# Patient Record
Sex: Male | Born: 1955
Health system: Southern US, Community
[De-identification: ages and names within clinical notes are randomized; demographics above are authoritative.]

## PROBLEM LIST (undated history)

## (undated) DIAGNOSIS — E7800 Pure hypercholesterolemia, unspecified: Secondary | ICD-10-CM

## (undated) DIAGNOSIS — H9319 Tinnitus, unspecified ear: Secondary | ICD-10-CM

## (undated) DIAGNOSIS — E291 Testicular hypofunction: Secondary | ICD-10-CM

## (undated) HISTORY — PX: CARDIOVERSION: SHX1299

## (undated) HISTORY — DX: Tinnitus, unspecified ear: H93.19

## (undated) HISTORY — DX: Pure hypercholesterolemia, unspecified: E78.00

## (undated) HISTORY — DX: Testicular hypofunction: E29.1

---

## 2013-12-26 ENCOUNTER — Encounter: Payer: 59 | Attending: Family Medicine | Admitting: Dietician

## 2013-12-26 DIAGNOSIS — Z713 Dietary counseling and surveillance: Secondary | ICD-10-CM | POA: Insufficient documentation

## 2013-12-30 NOTE — Progress Notes (Signed)
Patient was seen on 12/26/2013 for the Weight Loss Class at the [facility]. The following learning objectives were met by the patient during this class:   Describe healthy choices in each food group  Describe portion size of foods  Use plate method for meal planning  Demonstrate how to read Nutrition Facts food label  Set realistic goals for weight loss, diet changes, and physical activity.   Goals:  1. Make healthy food choices in each food group.  2. Reduce portion size of foods.  3. Increase fruit and vegetable intake.  4. Use plate method for meal planning.  5. Increase physical activity.   Handouts given:  1. Weight loss tips 2. Meal plan/portion card 2. Plate method  2. Food label handout

## 2015-07-13 ENCOUNTER — Telehealth: Payer: 59 | Admitting: Family

## 2015-07-13 DIAGNOSIS — J019 Acute sinusitis, unspecified: Secondary | ICD-10-CM

## 2015-07-13 MED ORDER — AMOXICILLIN-POT CLAVULANATE 875-125 MG PO TABS: 1.0000 | ORAL_TABLET | Freq: Two times a day (BID) | ORAL | Status: DC

## 2015-07-13 NOTE — Progress Notes (Signed)

## 2015-07-14 MED FILL — AMOX-CLAV 875-125 MG TABLET: 875-125 | 7 days supply | Qty: 14 | Fill #0

## 2015-07-18 ENCOUNTER — Encounter: Payer: Self-pay | Admitting: *Deleted

## 2015-07-18 ENCOUNTER — Encounter: Payer: 59 | Attending: Family Medicine | Admitting: *Deleted

## 2015-07-18 DIAGNOSIS — Z713 Dietary counseling and surveillance: Secondary | ICD-10-CM | POA: Diagnosis not present

## 2015-07-18 DIAGNOSIS — E663 Overweight: Secondary | ICD-10-CM

## 2015-07-18 NOTE — Patient Instructions (Signed)
-    Follow MyPlate guidelines with half of the plate fruits/vegetables, 1/4 protein, 1/4 grains - Have a snack between lunch and dinner that includes a carbohydrate and protein source - Include grains at lunch and dinner - Consume a protein/carbohydrate combined shake or snack within 30 minutes post workout - Try weight lifting prior to cardio activities to gauge strength levels during resistance training - Increase intake at breakfast - add nuts to yogurt/fruit combo

## 2015-07-18 NOTE — Progress Notes (Signed)
  Medical Nutrition Therapy:  Appt start time: 0800 end time:  0900.   Assessment:  Primary concerns today: pt is a [employer] employee who is hoping to qualify for his healthy weight badge. Pt is interested in weight loss. Pt is down 5 lbs in last 5 days because pt recently had the flu. Has a goal of losing 5 lbs from where pt is now. Pt talks to doctor about BMI and doctor said not to worry about it but pt wants to weigh under 180 lbs. Pt is currently around 182, self reported. Pt used the Tanita scale to see breakdown of body fat vs fat free mass. Pt has been restricting carbohydrates in the diet in order to lose weight.    Preferred Learning Style:   No preference indicated   Learning Readiness:   Ready   MEDICATIONS: see medication list.    DIETARY INTAKE:  Usual eating pattern includes 3 meals and 1 snack per day.  Everyday foods include breakfast foods.  Avoided foods include potatoes, fried foods, pasta.    24-hr recall:  B ( AM): greek yogurt (1/2 cup), fruit, skim milk Snk ( AM): on occasion - nuts or kind bar L ( PM): salad or leftovers - bring lunch Snk ( PM): none D ( PM): grilled meats and vegetables Snk ( PM): none Beverages: water(3-4 bottles) and with meals and coffee, red wine (2 glasses)  Usual physical activity: Gym 3 days per week: walk 5 min warm up, stair master 54min, walk 71min, weight room - full body bench press, row, leg exercises, abs, shoulders, arms. Heavy on Saturday (3 sets, 8 reps). 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      Nutritional Diagnosis:  NB-1.1 Food and nutrition-related knowledge deficit As related to proper balance of carbohydrates, fat, and protein.  As evidenced by dietary recall.    Intervention:  Nutrition counseling provided. Talked to pt about MyPlate and how to balance meals and snacks. Talked about inadequate intake based on energy needs and how this can slow down the metabolism. Talked about fueling for exercise, pre and post.   Teaching  Method Utilized:  Visual Auditory   Handouts given during visit include:  Snack List  Barriers to learning/adherence to lifestyle change: none  Demonstrated degree of understanding via:  Teach Back   Monitoring/Evaluation:  Dietary intake, exercise, and body weight prn.

## 2015-08-12 MED FILL — MOMETASONE FUROATE 50 MCG S: 50 | 90 days supply | Qty: 51 | Fill #0

## 2015-08-15 MED FILL — SIMVASTATIN 40 MG TABLET: 40 | 90 days supply | Qty: 90 | Fill #0

## 2015-08-24 DIAGNOSIS — H5213 Myopia, bilateral: Secondary | ICD-10-CM | POA: Diagnosis not present

## 2015-08-24 DIAGNOSIS — H524 Presbyopia: Secondary | ICD-10-CM | POA: Diagnosis not present

## 2015-08-24 DIAGNOSIS — H40053 Ocular hypertension, bilateral: Secondary | ICD-10-CM | POA: Diagnosis not present

## 2015-08-24 DIAGNOSIS — H52223 Regular astigmatism, bilateral: Secondary | ICD-10-CM | POA: Diagnosis not present

## 2015-11-15 MED FILL — SIMVASTATIN 40 MG TABLET: 40 | 90 days supply | Qty: 90 | Fill #1

## 2015-11-15 MED FILL — MOMETASONE FUROATE 50 MCG S: 50 | 90 days supply | Qty: 51 | Fill #1

## 2016-01-31 MED FILL — SIMVASTATIN 40 MG TABLET: 40 | 90 days supply | Qty: 90 | Fill #2

## 2016-03-19 DIAGNOSIS — Z0000 Encounter for general adult medical examination without abnormal findings: Secondary | ICD-10-CM | POA: Diagnosis not present

## 2016-03-21 DIAGNOSIS — Z1389 Encounter for screening for other disorder: Secondary | ICD-10-CM | POA: Diagnosis not present

## 2016-03-21 DIAGNOSIS — Z0000 Encounter for general adult medical examination without abnormal findings: Secondary | ICD-10-CM | POA: Diagnosis not present

## 2016-03-21 DIAGNOSIS — Z9181 History of falling: Secondary | ICD-10-CM | POA: Diagnosis not present

## 2016-03-21 MED FILL — NYSTATIN/TRIAMCINOLONE CRM: 100000-0.1 | 30 days supply | Qty: 30 | Fill #0

## 2016-04-17 MED FILL — SIMVASTATIN 40 MG TABLET: 40 | 90 days supply | Qty: 90 | Fill #0

## 2016-04-17 MED FILL — NYSTATIN-TRIAMCINOLONE CRM: 100000-0.1 | 30 days supply | Qty: 30 | Fill #1

## 2016-04-17 MED FILL — MOMETASONE FUROATE 50 MCG S: 50 | 90 days supply | Qty: 51 | Fill #0

## 2016-05-09 DIAGNOSIS — L821 Other seborrheic keratosis: Secondary | ICD-10-CM | POA: Diagnosis not present

## 2016-05-09 DIAGNOSIS — L905 Scar conditions and fibrosis of skin: Secondary | ICD-10-CM | POA: Diagnosis not present

## 2016-05-09 DIAGNOSIS — D485 Neoplasm of uncertain behavior of skin: Secondary | ICD-10-CM | POA: Diagnosis not present

## 2016-05-09 DIAGNOSIS — L739 Follicular disorder, unspecified: Secondary | ICD-10-CM | POA: Diagnosis not present

## 2016-05-09 DIAGNOSIS — L720 Epidermal cyst: Secondary | ICD-10-CM | POA: Diagnosis not present

## 2016-05-11 DIAGNOSIS — Z79899 Other long term (current) drug therapy: Secondary | ICD-10-CM | POA: Diagnosis not present

## 2016-05-11 DIAGNOSIS — Z0000 Encounter for general adult medical examination without abnormal findings: Secondary | ICD-10-CM | POA: Diagnosis not present

## 2016-05-18 DIAGNOSIS — E291 Testicular hypofunction: Secondary | ICD-10-CM | POA: Diagnosis not present

## 2016-05-18 DIAGNOSIS — Z1389 Encounter for screening for other disorder: Secondary | ICD-10-CM | POA: Diagnosis not present

## 2016-05-18 DIAGNOSIS — R5383 Other fatigue: Secondary | ICD-10-CM | POA: Diagnosis not present

## 2016-07-13 MED FILL — NYSTATIN-TRIAMCINOLONE CRM: 100000-0.1 | 30 days supply | Qty: 30 | Fill #2

## 2016-07-13 MED FILL — SIMVASTATIN 40 MG TABLET: 40 | 90 days supply | Qty: 90 | Fill #1

## 2016-10-08 MED FILL — SIMVASTATIN 40 MG TABLET: 40 | 90 days supply | Qty: 90 | Fill #2

## 2016-11-20 ENCOUNTER — Telehealth: Payer: 59 | Admitting: Family

## 2016-11-20 DIAGNOSIS — L237 Allergic contact dermatitis due to plants, except food: Secondary | ICD-10-CM | POA: Diagnosis not present

## 2016-11-20 MED ORDER — TRIAMCINOLONE ACETONIDE 0.1 % EX CREA: 1.0000 "application " | TOPICAL_CREAM | Freq: Two times a day (BID) | CUTANEOUS | 0 refills | Status: DC

## 2016-11-20 MED FILL — TRIAMCINOLONE 0.1% CREAM: 0.1 | 14 days supply | Qty: 30 | Fill #0

## 2016-11-20 NOTE — Progress Notes (Signed)
E Visit for Rash  We are sorry that you are not feeling well. Here is how we plan to help!  Based on what you shared with me it looks like you have contact dermatitis.  Contact dermatitis is a skin rash caused by something that touches the skin and causes irritation or inflammation.  Your skin may be red, swollen, dry, cracked, and itch.  The rash should go away in a few days but can last a few weeks.  If you get a rash, it's important to figure out what caused it so the irritant can be avoided in the future.  Triamcinolone cream apply twice a day x 1 week. It has been sent to the pharmacy.    HOME CARE:   Take cool showers and avoid direct sunlight.  Apply cool compress or wet dressings.  Take a bath in an oatmeal bath.  Sprinkle content of one Aveeno packet under running faucet with comfortably warm water.  Bathe for 15-20 minutes, 1-2 times daily.  Pat dry with a towel. Do not rub the rash.  Use hydrocortisone cream.  Take an antihistamine like Benadryl for widespread rashes that itch.  The adult dose of Benadryl is 25-50 mg by mouth 4 times daily.  Caution:  This type of medication may cause sleepiness.  Do not drink alcohol, drive, or operate dangerous machinery while taking antihistamines.  Do not take these medications if you have prostate enlargement.  Read package instructions thoroughly on all medications that you take.  GET HELP RIGHT AWAY IF:   Symptoms Jazmine't go away after treatment.  Severe itching that persists.  If you rash spreads or swells.  If you rash begins to smell.  If it blisters and opens or develops a yellow-brown crust.  You develop a fever.  You have a sore throat.  You become short of breath.  MAKE SURE YOU:  Understand these instructions. Will watch your condition. Will get help right away if you are not doing well or get worse.  Thank you for choosing an e-visit. Your e-visit answers were reviewed by a board certified advanced clinical  practitioner to complete your personal care plan. Depending upon the condition, your plan could have included both over the counter or prescription medications. Please review your pharmacy choice. Be sure that the pharmacy you have chosen is open so that you can pick up your prescription now.  If there is a problem you may message your provider in [place] to have the prescription routed to another pharmacy. Your safety is important to [place]. If you have drug allergies check your prescription carefully.  For the next 24 hours, you can use MyChart to ask questions about today's visit, request a non-urgent call back, or ask for a work or school excuse from your e-visit provider. You will get an email in the next two days asking about your experience. I hope that your e-visit has been valuable and will speed your recovery.

## 2016-12-24 MED FILL — CLINDAMYCIN HCL 150 MG CAPS: 150 | 6 days supply | Qty: 28 | Fill #0

## 2017-01-01 MED FILL — CLINDAMYCIN HCL 150 MG CAPS: 150 | 6 days supply | Qty: 28 | Fill #1

## 2017-01-01 MED FILL — SIMVASTATIN 40 MG TABLET: 40 | 90 days supply | Qty: 90 | Fill #3

## 2017-01-15 MED FILL — CLINDAMYCIN HCL 150 MG CAPS: 150 | 7 days supply | Qty: 28 | Fill #0

## 2017-01-23 MED FILL — CLINDAMYCIN HCL 150 MG CAPS: 150 | 7 days supply | Qty: 28 | Fill #1

## 2017-04-03 MED FILL — SIMVASTATIN 40 MG TABLET: 40 | 90 days supply | Qty: 90 | Fill #0

## 2017-05-07 DIAGNOSIS — Z6829 Body mass index (BMI) 29.0-29.9, adult: Secondary | ICD-10-CM | POA: Diagnosis not present

## 2017-05-07 DIAGNOSIS — Z125 Encounter for screening for malignant neoplasm of prostate: Secondary | ICD-10-CM | POA: Diagnosis not present

## 2017-05-07 DIAGNOSIS — Z0000 Encounter for general adult medical examination without abnormal findings: Secondary | ICD-10-CM | POA: Diagnosis not present

## 2017-05-07 MED FILL — MOMETASONE FUROATE 50 MCG S: 50 | 90 days supply | Qty: 51 | Fill #0

## 2017-05-07 MED FILL — ATORVASTATIN 10 MG TABLET: 10 | 90 days supply | Qty: 90 | Fill #0

## 2017-08-01 MED FILL — ATORVASTATIN 10 MG TABLET: 10 | 90 days supply | Qty: 90 | Fill #1

## 2017-08-09 DIAGNOSIS — H5213 Myopia, bilateral: Secondary | ICD-10-CM | POA: Diagnosis not present

## 2017-10-29 MED FILL — ATORVASTATIN 10 MG TABLET: 10 | 90 days supply | Qty: 90 | Fill #0

## 2018-01-13 ENCOUNTER — Telehealth: Payer: Self-pay | Admitting: *Deleted

## 2018-01-13 DIAGNOSIS — Z1331 Encounter for screening for depression: Secondary | ICD-10-CM | POA: Diagnosis not present

## 2018-01-13 DIAGNOSIS — Z0000 Encounter for general adult medical examination without abnormal findings: Secondary | ICD-10-CM | POA: Diagnosis not present

## 2018-01-13 DIAGNOSIS — Z6828 Body mass index (BMI) 28.0-28.9, adult: Secondary | ICD-10-CM | POA: Diagnosis not present

## 2018-01-13 DIAGNOSIS — Z1339 Encounter for screening examination for other mental health and behavioral disorders: Secondary | ICD-10-CM | POA: Diagnosis not present

## 2018-01-13 DIAGNOSIS — R9431 Abnormal electrocardiogram [ECG] [EKG]: Secondary | ICD-10-CM | POA: Diagnosis not present

## 2018-01-13 MED FILL — NITROGLYCERIN 0.4 MG TAB SL: 0.4 | 8 days supply | Qty: 25 | Fill #0

## 2018-01-13 NOTE — Telephone Encounter (Signed)
Patient informed of appointment scheduled with Dr. Agustin Cree per Dr. Lin Landsman due to abnormal EKG. Patient will be seen in the [facility] office tomorrow, 01/14/18 at 1:40 pm. Patient provided with address and phone number and advised per Dr. Agustin Cree that he can work in the morning. Patient verbalized understanding. No further questions.

## 2018-01-14 ENCOUNTER — Ambulatory Visit (INDEPENDENT_AMBULATORY_CARE_PROVIDER_SITE_OTHER): Payer: 59 | Admitting: Cardiology

## 2018-01-14 ENCOUNTER — Encounter: Payer: Self-pay | Admitting: Cardiology

## 2018-01-14 DIAGNOSIS — E7800 Pure hypercholesterolemia, unspecified: Secondary | ICD-10-CM | POA: Diagnosis not present

## 2018-01-14 DIAGNOSIS — H9319 Tinnitus, unspecified ear: Secondary | ICD-10-CM | POA: Diagnosis not present

## 2018-01-14 DIAGNOSIS — E291 Testicular hypofunction: Secondary | ICD-10-CM | POA: Diagnosis not present

## 2018-01-14 DIAGNOSIS — E663 Overweight: Secondary | ICD-10-CM | POA: Diagnosis not present

## 2018-01-14 DIAGNOSIS — R9431 Abnormal electrocardiogram [ECG] [EKG]: Secondary | ICD-10-CM | POA: Insufficient documentation

## 2018-01-14 HISTORY — DX: Abnormal electrocardiogram (ECG) (EKG): R94.31

## 2018-01-14 NOTE — Patient Instructions (Signed)
Medication Instructions:  Your physician recommends that you continue on your current medications as directed. Please refer to the Current Medication list given to you today.   Labwork: None  Testing/Procedures: You had an EKG today.   Your physician has requested that you have cardiac CT calcium score. Cardiac computed tomography (CT) is a painless test that uses an x-ray machine to take clear, detailed pictures of your heart. For further information please visit [email or web address]. Please follow instruction sheet as given.   Your physician has requested that you have an echocardiogram. Echocardiography is a painless test that uses sound waves to create images of your heart. It provides your doctor with information about the size and shape of your heart and how well your heart's chambers and valves are working. This procedure takes approximately one hour. There are no restrictions for this procedure.   Follow-Up: Your physician recommends that you schedule a follow-up appointment in: 1 month.   If you need a refill on your cardiac medications before your next appointment, please call your pharmacy.   Thank you for choosing [facility]! Robyne Peers, RN [phone]   Echocardiogram An echocardiogram, or echocardiography, uses sound waves (ultrasound) to produce an image of your heart. The echocardiogram is simple, painless, obtained within a short period of time, and offers valuable information to your health care provider. The images from an echocardiogram can provide information such as:  Evidence of coronary artery disease (CAD).  Heart size.  Heart muscle function.  Heart valve function.  Aneurysm detection.  Evidence of a past heart attack.  Fluid buildup around the heart.  Heart muscle thickening.  Assess heart valve function.  Tell a health care provider about:  Any allergies you have.  All medicines you are taking, including vitamins, herbs, eye  drops, creams, and over-the-counter medicines.  Any problems you or family members have had with anesthetic medicines.  Any blood disorders you have.  Any surgeries you have had.  Any medical conditions you have.  Whether you are pregnant or may be pregnant. What happens before the procedure? No special preparation is needed. Eat and drink normally. What happens during the procedure?  In order to produce an image of your heart, gel will be applied to your chest and a wand-like tool (transducer) will be moved over your chest. The gel will help transmit the sound waves from the transducer. The sound waves will harmlessly bounce off your heart to allow the heart images to be captured in real-time motion. These images will then be recorded.  You may need an IV to receive a medicine that improves the quality of the pictures. What happens after the procedure? You may return to your normal schedule including diet, activities, and medicines, unless your health care provider tells you otherwise. This information is not intended to replace advice given to you by your health care provider. Make sure you discuss any questions you have with your health care provider. Document Released: 05/04/2000 Document Revised: 12/24/2015 Document Reviewed: 01/12/2013 Elsevier Interactive Patient Education  2017 Elsevier Inc.     Coronary Calcium Scan A coronary calcium scan is an imaging test used to look for deposits of calcium and other fatty materials (plaques) in the inner lining of the blood vessels of the heart (coronary arteries). These deposits of calcium and plaques can partly clog and narrow the coronary arteries without producing any symptoms or warning signs. This puts a person at risk for a heart attack. This test can detect these deposits  before symptoms develop. Tell a health care provider about:  Any allergies you have.  All medicines you are taking, including vitamins, herbs, eye drops,  creams, and over-the-counter medicines.  Any problems you or family members have had with anesthetic medicines.  Any blood disorders you have.  Any surgeries you have had.  Any medical conditions you have.  Whether you are pregnant or may be pregnant. What are the risks? Generally, this is a safe procedure. However, problems may occur, including:  Harm to a pregnant woman and her unborn baby. This test involves the use of radiation. Radiation exposure can be dangerous to a pregnant woman and her unborn baby. If you are pregnant, you generally should not have this procedure done.  Slight increase in the risk of cancer. This is because of the radiation involved in the test.  What happens before the procedure? No preparation is needed for this procedure. What happens during the procedure?  You will undress and remove any jewelry around your neck or chest.  You will put on a hospital gown.  Sticky electrodes will be placed on your chest. The electrodes will be connected to an electrocardiogram (ECG) machine to record a tracing of the electrical activity of your heart.  A CT scanner will take pictures of your heart. During this time, you will be asked to lie still and hold your breath for 2-3 seconds while a picture of your heart is being taken. The procedure may vary among health care providers and hospitals. What happens after the procedure?  You can get dressed.  You can return to your normal activities.  It is up to you to get the results of your test. Ask your health care provider, or the department that is doing the test, when your results will be ready. Summary  A coronary calcium scan is an imaging test used to look for deposits of calcium and other fatty materials (plaques) in the inner lining of the blood vessels of the heart (coronary arteries).  Generally, this is a safe procedure. Tell your health care provider if you are pregnant or may be pregnant.  No preparation  is needed for this procedure.  A CT scanner will take pictures of your heart.  You can return to your normal activities after the scan is done. This information is not intended to replace advice given to you by your health care provider. Make sure you discuss any questions you have with your health care provider. Document Released: 11/03/2007 Document Revised: 03/26/2016 Document Reviewed: 03/26/2016 Elsevier Interactive Patient Education  2017 [employer].

## 2018-01-14 NOTE — Progress Notes (Signed)
Cardiology Consultation:    Date:  01/14/2018   ID:  Evan Russell, DOB 27-Jul-1955, MRN [id number]  PCP:  Angelina Sheriff, MD  Cardiologist:  Jenne Campus, MD   Referring MD: No ref. provider found   Chief Complaint  Patient presents with  . New Patient (Initial Visit)    no chest pain , no swelling,   I have abnormal EKG  History of Present Illness:    Evan Russell is a 62 y.o. male who is being seen today for the evaluation of abnormal EKG at the request of Lovette Cliche, MD. yesterday he went for regular physical examination he feels fine does not have any chest pain tightness squeezing pressure burning chest no dizziness no passing out.  He is very active he exercises on the regular basis he goes to gym 3-4 times a week.  He does aerobic exercise he will lift some weights and have no difficulty doing it.  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  He denies having any chest pain no tightness.  He tells me that about 20 years ago he gets an EKG done there was some abnormality identified and he got quite extensive evaluation done at that time which include stress test.  He did very well on the treadmill and there was no problems. He does have dyslipidemia however his HDL is very high which is of course cardioprotective.  He takes Lipitor for his mildly elevated LDL. He never smoked sometimes he smokes cigars but very rarely There is no history of family premature coronary artery disease.  No hypertension no diabetes  Past Medical History:  Diagnosis Date  . Androgen deficiency   . Elevated cholesterol   . Tinnitus     No past surgical history on file.  Current Medications: Current Meds  Medication Sig  . aspirin 81 MG tablet Take 81 mg by mouth daily.  Marland Kitchen atorvastatin (LIPITOR) 10 MG tablet   . fexofenadine (ALLEGRA) 180 MG tablet Take 180 mg by  mouth daily.  . Multiple Vitamin (MULTIVITAMIN) tablet Take 1 tablet by mouth daily.  . simvastatin (ZOCOR) 40 MG tablet Take 40 mg by mouth daily.  Marland Kitchen triamcinolone cream (KENALOG) 0.1 % Apply 1 application topically 2 (two) times daily.     Allergies:   Patient has no allergy information on record.   Social History   Socioeconomic History  . Marital status: Married    Spouse name: Not on file  . Number of children: Not on file  . Years of education: Not on file  . Highest education level: Not on file  Occupational History  . Not on file  Social Needs  . Financial resource strain: Not on file  . Food insecurity:    Worry: Not on file    Inability: Not on file  . Transportation needs:    Medical: Not on file    Non-medical: Not on file  Tobacco Use  . Smoking status: Never Smoker  . Smokeless tobacco: Never Used  Substance and Sexual Activity  . Alcohol use: Yes    Alcohol/week: 2.0 standard drinks    Types: 2 Glasses of wine per week    Comment: 2 glasses a day   . Drug use: Not on file  . Sexual activity: Not on file  Lifestyle  . Physical activity:  Days per week: Not on file    Minutes per session: Not on file  . Stress: Not on file  Relationships  . Social connections:    Talks on phone: Not on file    Gets together: Not on file    Attends religious service: Not on file    Active member of club or organization: Not on file    Attends meetings of clubs or organizations: Not on file    Relationship status: Not on file  Other Topics Concern  . Not on file  Social History Narrative  . Not on file     Family History: The patient's family history includes Atrial fibrillation in her mother; CAD in her father; Heart failure in her mother; Hyperlipidemia in her father; Idiopathic pulmonary fibrosis in her sister; Multiple myeloma in her father; Narcolepsy in her mother. ROS:   Please see the history of present illness.    All 14 point review of systems negative  except as described per history of present illness.  EKGs/Labs/Other Studies Reviewed:    The following studies were reviewed today: EKG showed normal sinus rhythm normal P interval normal QS complex duration morphology asymmetrical T inversion in lead V2 to V5.  EKG:  EKG is   The ekg ordered today demonstrates Normal sinus rhythm short PR interval normal QS complex duration morphology nonspecific ST segment changes ordered today.     Recent Labs: No results found for requested labs within last 8760 hours.  Recent Lipid Panel No results found for: CHOL, TRIG, HDL, CHOLHDL, VLDL, LDLCALC, LDLDIRECT  Physical Exam:    VS:  BP 128/80   Pulse 68   Ht '5\' 8"'  (1.727 m)   Wt 186 lb 12.8 oz (84.7 kg)   BMI 28.40 kg/m     Wt Readings from Last 3 Encounters:  01/14/18 186 lb 12.8 oz (84.7 kg)     GEN:  Well nourished, well developed in no acute distress HEENT: Normal NECK: No JVD; No carotid bruits LYMPHATICS: No lymphadenopathy CARDIAC: RRR, no murmurs, no rubs, no gallops RESPIRATORY:  Clear to auscultation without rales, wheezing or rhonchi  ABDOMEN: Soft, non-tender, non-distended MUSCULOSKELETAL:  No edema; No deformity  SKIN: Warm and dry NEUROLOGIC:  Alert and oriented x 3 PSYCHIATRIC:  Normal affect   ASSESSMENT:    1. Overweight   2. Tinnitus, unspecified laterality   3. Elevated cholesterol   4. Androgen deficiency   5. Abnormal electrocardiogram   6. Abnormal EKG    PLAN:    In order of problems listed above:  1. Abnormal EKG.  Quite disturbing changes in the echocardiogram however he is absolutely completely asymptomatic he works out on the regular basis have no difficulty doing it.  Still I think his EKG required some clarification.  I will ask him to have an echocardiogram done to make sure he does not develop cardiomyopathy.  He tells me that 20 years ago he was told to have athlete's heart.  I do not know exactly what that meant for 20 years ago but still  echocardiogram is in order to check his left ventricle wall thickness.  Changes on EKG that he got good indicating ischemia as well.  He has absolutely no signs and symptoms of it I think the best test for him will be to do calcium score if calcium score is negative then chances for him to have significant coronary event within the next 10 years is very low.  Again he is asymptomatic  while exercising aggressively. 2. Dyslipidemia he is on Lipitor and I will continue. 3. I asked him to keep taking one baby aspirin every single day.  I told him to take it easy with the gym until we get echocardiogram and his calcium score back.   Medication Adjustments/Labs and Tests Ordered: Current medicines are reviewed at length with the patient today.  Concerns regarding medicines are outlined above.  Orders Placed This Encounter  Procedures  . CT CARDIAC SCORING  . EKG 12-Lead  . ECHOCARDIOGRAM COMPLETE   No orders of the defined types were placed in this encounter.   Signed, Park Liter, MD, [facility]. 01/14/2018 2:50 PM    [facility]

## 2018-01-17 ENCOUNTER — Other Ambulatory Visit: Payer: Self-pay

## 2018-01-17 ENCOUNTER — Ambulatory Visit (HOSPITAL_COMMUNITY): Payer: 59 | Attending: Cardiology

## 2018-01-17 DIAGNOSIS — I081 Rheumatic disorders of both mitral and tricuspid valves: Secondary | ICD-10-CM | POA: Diagnosis not present

## 2018-01-17 DIAGNOSIS — R9431 Abnormal electrocardiogram [ECG] [EKG]: Secondary | ICD-10-CM

## 2018-01-22 ENCOUNTER — Telehealth: Payer: Self-pay | Admitting: Emergency Medicine

## 2018-01-22 ENCOUNTER — Ambulatory Visit (INDEPENDENT_AMBULATORY_CARE_PROVIDER_SITE_OTHER)
Admission: RE | Admit: 2018-01-22 | Discharge: 2018-01-22 | Disposition: A | Discharge status: Home / Self Care | Payer: Self-pay | Source: Ambulatory Visit | Attending: Cardiology | Admitting: Cardiology

## 2018-01-22 DIAGNOSIS — R9431 Abnormal electrocardiogram [ECG] [EKG]: Secondary | ICD-10-CM

## 2018-01-22 NOTE — Telephone Encounter (Signed)
Left message for patient to return call.

## 2018-01-27 MED FILL — ATORVASTATIN 10 MG TABLET: 10 | 90 days supply | Qty: 90 | Fill #1

## 2018-01-28 DIAGNOSIS — L814 Other melanin hyperpigmentation: Secondary | ICD-10-CM | POA: Diagnosis not present

## 2018-01-28 DIAGNOSIS — L821 Other seborrheic keratosis: Secondary | ICD-10-CM | POA: Diagnosis not present

## 2018-01-28 DIAGNOSIS — D229 Melanocytic nevi, unspecified: Secondary | ICD-10-CM | POA: Diagnosis not present

## 2018-01-28 DIAGNOSIS — L578 Other skin changes due to chronic exposure to nonionizing radiation: Secondary | ICD-10-CM | POA: Diagnosis not present

## 2018-02-24 ENCOUNTER — Ambulatory Visit (INDEPENDENT_AMBULATORY_CARE_PROVIDER_SITE_OTHER): Payer: 59 | Admitting: Cardiology

## 2018-02-24 ENCOUNTER — Encounter: Payer: Self-pay | Admitting: Cardiology

## 2018-02-24 VITALS — BP 140/64 | HR 67 | Ht 68.0 in | Wt 188.1 lb

## 2018-02-24 DIAGNOSIS — Z01812 Encounter for preprocedural laboratory examination: Secondary | ICD-10-CM | POA: Diagnosis not present

## 2018-02-24 DIAGNOSIS — E7800 Pure hypercholesterolemia, unspecified: Secondary | ICD-10-CM | POA: Diagnosis not present

## 2018-02-24 DIAGNOSIS — R9431 Abnormal electrocardiogram [ECG] [EKG]: Secondary | ICD-10-CM | POA: Diagnosis not present

## 2018-02-24 DIAGNOSIS — I425 Other restrictive cardiomyopathy: Secondary | ICD-10-CM

## 2018-02-24 NOTE — Patient Instructions (Signed)
Medication Instructions:  Your physician recommends that you continue on your current medications as directed. Please refer to the Current Medication list given to you today.  If you need a refill on your cardiac medications before your next appointment, please call your pharmacy.   Lab work: Your physician recommends that you return for lab work today: BMP If you have labs (blood work) drawn today and your tests are completely normal, you will receive your results only by: Marland Kitchen MyChart Message (if you have MyChart) OR . A paper copy in the mail If you have any lab test that is abnormal or we need to change your treatment, we will call you to review the results.  Testing/Procedures: Your physician has requested that you have a cardiac MRI. Cardiac MRI uses a computer to create images of your heart as its beating, producing both still and moving pictures of your heart and major blood vessels. For further information please visit [email or web address] Please follow the instruction sheet given to you today for more information.    Follow-Up: At [facility], you and your health needs are our priority.  As part of our continuing mission to provide you with exceptional heart care, we have created designated Provider Care Teams.  These Care Teams include your primary Cardiologist (physician) and Advanced Practice Providers (APPs -  Physician Assistants and Nurse Practitioners) who all work together to provide you with the care you need, when you need it. You will need a follow up appointment in 6 months.  Please call our office 2 months in advance to schedule this appointment.  You may see No primary care provider on file. or another member of our [employer] Provider Team in [place]: Shirlee More, MD . Jyl Heinz, MD  Any Other Special Instructions Will Be Listed Below (If Applicable).

## 2018-02-25 ENCOUNTER — Telehealth: Payer: Self-pay | Admitting: Emergency Medicine

## 2018-02-25 LAB — BASIC METABOLIC PANEL
BUN/Creatinine Ratio: 16 (ref 10–24)
BUN: 21 mg/dL (ref 8–27)
CALCIUM: 10.4 mg/dL — AB (ref 8.6–10.2)
CO2: 22 mmol/L (ref 20–29)
Chloride: 105 mmol/L (ref 96–106)
Creatinine, Ser: 1.28 mg/dL — ABNORMAL HIGH (ref 0.76–1.27)
GFR, EST AFRICAN AMERICAN: 69 mL/min/{1.73_m2} (ref >59)
GFR, EST NON AFRICAN AMERICAN: 60 mL/min/{1.73_m2} (ref >59)
Glucose: 120 mg/dL — ABNORMAL HIGH (ref 65–99)
Potassium: 4.7 mmol/L (ref 3.5–5.2)
Sodium: 145 mmol/L — ABNORMAL HIGH (ref 134–144)

## 2018-02-25 NOTE — Progress Notes (Signed)
Cardiology Office Note:    Date:  02/25/2018   ID:  Evan Russell, DOB [birth year], MRN [id number]  PCP:  Angelina Sheriff, MD  Cardiologist:  Jenne Campus, MD    Referring MD: Angelina Sheriff, MD   Chief Complaint  Patient presents with  . Follow up on Echo  I am here to discuss results of my tests  History of Present Illness:    Evan Russell is a 62 y.o. male who is asymptomatic.  However, he does have quite dramatic EKG changes.  There is a symmetrical T inversion in lead V1 to V4.  He is very active he exercised on the regular basis does not have any symptoms there is no chest pain tightness squeezing pressure burning chest.  There is no dizziness no syncope.  There is no family history of premature cardiac death.  His grandfather apparently died what appears to be because of myocardial infarction apparently he started hurting and then he ended up dying couple hours later he does not appear to be sudden cardiac death.  He had quite extensive evaluation done in 2009 interestingly he brought his documentations today from that time including his EKG his EKG was identical to the one that we obtained today.  Similar symmetrical T inversion in anterior precordium.  He also had a stress test at that time which was good echocardiogram was done.  As a part of my evaluation he ended up getting echocardiogram which I reviewed.  Echocardiogram did not show anything unusual.  We did have difficulty seeing epic Simbiso suspicion he may have some thickening of the apical tissue.  He did have a calcium score done to see if there is any chances for him to have coronary artery disease or more importantly from prognostic point review.  He is completely asymptomatic therefore his stress test most likely would be normal however his calcium score came 0 therefore his long-term prognosis is good.  He is here today to talk about future steps of evaluating his heart.  Past Medical History:  Diagnosis Date  .  Androgen deficiency   . Elevated cholesterol   . Tinnitus     No past surgical history on file.  Current Medications: Current Meds  Medication Sig  . aspirin 81 MG tablet Take 81 mg by mouth daily.  Marland Kitchen atorvastatin (LIPITOR) 10 MG tablet   . fexofenadine (ALLEGRA) 180 MG tablet Take 180 mg by mouth as needed.   . Multiple Vitamin (MULTIVITAMIN) tablet Take 1 tablet by mouth daily.  . nitroGLYCERIN (NITROSTAT) 0.4 MG SL tablet   . triamcinolone cream (KENALOG) 0.1 % Apply 1 application topically 2 (two) times daily.     Allergies:   Patient has no known allergies.   Social History   Socioeconomic History  . Marital status: Married    Spouse name: Not on file  . Number of children: Not on file  . Years of education: Not on file  . Highest education level: Not on file  Occupational History  . Not on file  Social Needs  . Financial resource strain: Not on file  . Food insecurity:    Worry: Not on file    Inability: Not on file  . Transportation needs:    Medical: Not on file    Non-medical: Not on file  Tobacco Use  . Smoking status: Never Smoker  . Smokeless tobacco: Never Used  Substance and Sexual Activity  . Alcohol use: Yes  Alcohol/week: 2.0 standard drinks    Types: 2 Glasses of wine per week    Comment: 2 glasses a day   . Drug use: Not on file  . Sexual activity: Not on file  Lifestyle  . Physical activity:    Days per week: Not on file    Minutes per session: Not on file  . Stress: Not on file  Relationships  . Social connections:    Talks on phone: Not on file    Gets together: Not on file    Attends religious service: Not on file    Active member of club or organization: Not on file    Attends meetings of clubs or organizations: Not on file    Relationship status: Not on file  Other Topics Concern  . Not on file  Social History Narrative  . Not on file     Family History: The patient's family history includes Atrial fibrillation in his  mother; CAD in his father; Heart failure in his mother; Hyperlipidemia in his father; Idiopathic pulmonary fibrosis in his sister; Multiple myeloma in his father; Narcolepsy in his mother. ROS:   Please see the history of present illness.    All 14 point review of systems negative except as described per history of present illness  EKGs/Labs/Other Studies Reviewed:      Recent Labs: 02/24/2018: BUN 21; Creatinine, Ser 1.28; Potassium 4.7; Sodium 145  Recent Lipid Panel No results found for: CHOL, TRIG, HDL, CHOLHDL, VLDL, LDLCALC, LDLDIRECT  Physical Exam:    VS:  BP 140/64   Pulse 67   Ht '5\' 8"'  (1.727 m)   Wt 188 lb 1.9 oz (85.3 kg)   SpO2 97%   BMI 28.60 kg/m     Wt Readings from Last 3 Encounters:  02/24/18 188 lb 1.9 oz (85.3 kg)  01/14/18 186 lb 12.8 oz (84.7 kg)     GEN:  Well nourished, well developed in no acute distress HEENT: Normal NECK: No JVD; No carotid bruits LYMPHATICS: No lymphadenopathy CARDIAC: RRR, no murmurs, no rubs, no gallops RESPIRATORY:  Clear to auscultation without rales, wheezing or rhonchi  ABDOMEN: Soft, non-tender, non-distended MUSCULOSKELETAL:  No edema; No deformity  SKIN: Warm and dry LOWER EXTREMITIES: no swelling NEUROLOGIC:  Alert and oriented x 3 PSYCHIATRIC:  Normal affect   ASSESSMENT:    1. Abnormal EKG   2. Elevated cholesterol   3. Pre-procedure lab exam   4. Other restrictive cardiomyopathy ([place])    PLAN:    In order of problems listed above:  1. Abnormal EKG so far work-up negative.  I think his is a definite diagnosis.  I will schedule him to have a MRI of his heart to look and see if there is any hypertrophy involving apex that we cannot clearly delineate on echocardiogram.  Also I would like to see a tissue characteristic especially in the apex.  If we find truly that he has some degree of hypertrophic cardia myopathy may be forced to wear monitor for 30 days to see if there is any arrhythmia.  Otherwise things  appears to be good. 2. Elevated cholesterol he takes appropriate medications.  Overall this is a quite interesting scenario.  I think finally we need to go to the point of this problem and trying to understand why he had such dramatic EKG changes.  He is very well aware of his EKG changes he does have a copies of his EKG.  He will relocated to [place] at  the end of the week.  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   Medication Adjustments/Labs and Tests Ordered: Current medicines are reviewed at length with the patient today.  Concerns regarding medicines are outlined above.  Orders Placed This Encounter  Procedures  . MR CARDIAC MORPHOLOGY W WO CONTRAST  . Basic metabolic panel   Medication changes: No orders of the defined types were placed in this encounter.   Signed, Park Liter, MD, [facility] 02/25/2018 8:17 AM    [place]

## 2018-02-25 NOTE — Telephone Encounter (Signed)
Informed patient that the scheduler for the mri is out of the office today and return tomorrow. Patient instructed to call office back if he hasn't been scheduled tomorrow.

## 2018-02-26 ENCOUNTER — Ambulatory Visit (HOSPITAL_COMMUNITY)
Admission: RE | Admit: 2018-02-26 | Discharge: 2018-02-26 | Disposition: A | Discharge status: Home / Self Care | Payer: 59 | Source: Ambulatory Visit | Attending: Cardiology | Admitting: Cardiology

## 2018-02-26 DIAGNOSIS — I425 Other restrictive cardiomyopathy: Secondary | ICD-10-CM | POA: Diagnosis not present

## 2018-02-26 LAB — CREATININE, SERUM
Creatinine, Ser: 1 mg/dL (ref 0.61–1.24)
GFR calc non Af Amer: >60 mL/min (ref >60)

## 2018-02-26 MED ORDER — GADOBUTROL 1 MMOL/ML IV SOLN
12.0000 mL | Freq: Once | INTRAVENOUS | Status: AC | PRN
  Administered 2018-02-26: 12 mL via INTRAVENOUS

## 2018-02-26 NOTE — Telephone Encounter (Signed)
Informed patient of opening today at 330 pm at [facility] radiologyt department for cardiac mri. Patient will be able to make this appointment. Confirmed appointment with Deedie Creed. Patient aware.

## 2018-02-28 ENCOUNTER — Telehealth: Payer: Self-pay | Admitting: Emergency Medicine

## 2018-02-28 NOTE — Telephone Encounter (Signed)
Left message for patient to return call.

## 2018-02-28 NOTE — Telephone Encounter (Signed)
Patient informed of results. He is moving and will call when he is back in town to have 30 day event monitor scheduled. Dr. Agustin Cree aware.

## 2018-05-15 DIAGNOSIS — D485 Neoplasm of uncertain behavior of skin: Secondary | ICD-10-CM | POA: Diagnosis not present

## 2018-05-15 DIAGNOSIS — L821 Other seborrheic keratosis: Secondary | ICD-10-CM | POA: Diagnosis not present

## 2018-05-23 MED FILL — ATORVASTATIN 10 MG TABLET: 10 | 30 days supply | Qty: 30 | Fill #0

## 2018-06-12 DIAGNOSIS — L821 Other seborrheic keratosis: Secondary | ICD-10-CM | POA: Diagnosis not present

## 2018-06-13 ENCOUNTER — Telehealth: Payer: 59 | Admitting: Family

## 2018-06-13 DIAGNOSIS — J111 Influenza due to unidentified influenza virus with other respiratory manifestations: Secondary | ICD-10-CM | POA: Diagnosis not present

## 2018-06-13 MED ORDER — OSELTAMIVIR PHOSPHATE 75 MG PO CAPS: 75.0000 mg | ORAL_CAPSULE | Freq: Two times a day (BID) | ORAL | 0 refills | Status: DC

## 2018-06-13 NOTE — Progress Notes (Signed)
Thank you for the details you included in the comment boxes. Those details are very helpful in determining the best course of treatment for you and help [place] to provide the best care.  E visit for Flu like symptoms   We are sorry that you are not feeling well.  Here is how we plan to help! Based on what you have shared with me it looks like you may have a respiratory virus that may be influenza.  Influenza or "the flu" is   an infection caused by a respiratory virus. The flu virus is highly contagious and persons who did not receive their yearly flu vaccination may "catch" the flu from close contact.  We have anti-viral medications to treat the viruses that cause this infection. They are not a "cure" and only shorten the course of the infection. These prescriptions are most effective when they are given within the first 2 days of "flu" symptoms. Antiviral medication are indicated if you have a high risk of complications from the flu. You should  also consider an antiviral medication if you are in close contact with someone who is at risk. These medications can help patients avoid complications from the flu  but have side effects that you should know. Possible side effects from Tamiflu or oseltamivir include nausea, vomiting, diarrhea, dizziness, headaches, eye redness, sleep problems or other respiratory symptoms. You should not take Tamiflu if you have an allergy to oseltamivir or any to the ingredients in Tamiflu.  Based upon your symptoms and potential risk factors I have prescribed Oseltamivir (Tamiflu).  It has been sent to your designated pharmacy.  You will take one 75 mg capsule orally twice a day for the next 5 days.  ANYONE WHO HAS FLU SYMPTOMS SHOULD: . Stay home. The flu is highly contagious and going out or to work exposes others! . Be sure to drink plenty of fluids. Water is fine as well as fruit juices, sodas and electrolyte beverages. You may want to stay away from caffeine or alcohol.  If you are nauseated, try taking small sips of liquids. How do you know if you are getting enough fluid? Your urine should be a pale yellow or almost colorless. . Get rest. . Taking a steamy shower or using a humidifier may help nasal congestion and ease sore throat pain. Using a saline nasal spray works much the same way. . Cough drops, hard candies and sore throat lozenges may ease your cough. . Line up a caregiver. Have someone check on you regularly.   GET HELP RIGHT AWAY IF: . You cannot keep down liquids or your medications. . You become short of breath . Your fell like you are going to pass out or loose consciousness. . Your symptoms persist after you have completed your treatment plan MAKE SURE YOU   Understand these instructions.  Will watch your condition.  Will get help right away if you are not doing well or get worse.  Your e-visit answers were reviewed by a board certified advanced clinical practitioner to complete your personal care plan.  Depending on the condition, your plan could have included both over the counter or prescription medications.  If there is a problem please reply  once you have received a response from your provider.  Your safety is important to [place].  If you have drug allergies check your prescription carefully.    You can use MyChart to ask questions about today's visit, request a non-urgent call back, or ask for  a work or school excuse for 24 hours related to this e-Visit. If it has been greater than 24 hours you will need to follow up with your provider, or enter a new e-Visit to address those concerns.  You will get an e-mail in the next two days asking about your experience.  I hope that your e-visit has been valuable and will speed your recovery. Thank you for using e-visits.

## 2018-09-05 ENCOUNTER — Telehealth: Payer: Self-pay | Admitting: Cardiology

## 2018-09-05 NOTE — Telephone Encounter (Signed)
Virtual Visit Pre-Appointment Phone Call  Steps For Call:  1. Confirm consent - "In the setting of the current Covid19 crisis, you are scheduled for a (phone or video) visit with your provider on (date) at (time).  Just as we do with many in-office visits, in order for you to participate in this visit, we must obtain consent.  If you'd like, I can send this to your mychart (if signed up) or email for you to review.  Otherwise, I can obtain your verbal consent now.  All virtual visits are billed to your insurance company just like a normal visit would be.  By agreeing to a virtual visit, we'd like you to understand that the technology does not allow for your provider to perform an examination, and thus may limit your provider's ability to fully assess your condition. If your provider identifies any concerns that need to be evaluated in person, we will make arrangements to do so.  Finally, though the technology is pretty good, we cannot assure that it will always work on either your or our end, and in the setting of a video visit, we may have to convert it to a phone-only visit.  In either situation, we cannot ensure that we have a secure connection.  Are you willing to proceed?" STAFF: Did the patient verbally acknowledge consent to telehealth visit? Document YES/NO here: YES  2. Confirm the BEST phone number to call the day of the visit by including in appointment notes  3. Give patient instructions for WebEx/MyChart download to smartphone as below or Doximity/Doxy.me if video visit (depending on what platform provider is using)  4. Advise patient to be prepared with their blood pressure, heart rate, weight, any heart rhythm information, their current medicines, and a piece of paper and pen handy for any instructions they may receive the day of their visit  5. Inform patient they will receive a phone call 15 minutes prior to their appointment time (may be from unknown caller ID) so they should be  prepared to answer  6. Confirm that appointment type is correct in Epic appointment notes (VIDEO vs PHONE)     TELEPHONE CALL NOTE  Evan Russell has been deemed a candidate for a follow-up tele-health visit to limit community exposure during the Covid-19 pandemic. I spoke with the patient via phone to ensure availability of phone/video source, confirm preferred email & phone number, and discuss instructions and expectations.  I reminded Evan Russell to be prepared with any vital sign and/or heart rhythm information that could potentially be obtained via home monitoring, at the time of his visit. I reminded Evan Russell to expect a phone call at the time of his visit if his visit.  Evan Russell 09/05/2018 4:22 PM   INSTRUCTIONS FOR DOWNLOADING THE [place] APP TO SMARTPHONE  - If Apple, ask patient to go to App Store and type in WebEx in the search bar. [place] [employer], the blue/green circle. If Android, go to [employer] and type in [employer] in the search bar. The app is free but as with any other app downloads, their phone may require them to verify saved payment information or Apple/Android password.  - The patient does NOT have to create an account. - On the day of the visit, the assist will walk the patient through joining the meeting with the meeting number/password.  INSTRUCTIONS FOR DOWNLOADING THE MYCHART APP TO SMARTPHONE  - The patient must first make  sure to have activated MyChart and know their login information - If Apple, go to [employer] and type in MyChart in the search bar and download the app. If Android, ask patient to go to [employer] and type in [place] in the search bar and download the app. The app is free but as with any other app downloads, their phone may require them to verify saved payment information or Apple/Android password.  - The patient will need to then log into the app with their MyChart username and password, and select [facility] as  their healthcare provider to link the account. When it is time for your visit, go to the MyChart app, find appointments, and click Begin Video Visit. Be sure to Select Allow for your device to access the Microphone and Camera for your visit. You will then be connected, and your provider will be with you shortly.  **If they have any issues connecting, or need assistance please contact MyChart service desk [phone])**  **If using a computer, in order to ensure the best quality for their visit they will need to use either of the following Internet Browsers: [employer], or Google Chrome**  IF USING DOXIMITY or DOXY.ME - The patient will receive a link just prior to their visit, either by text or email (to be determined day of appointment depending on if it's doxy.me or Doximity).     FULL LENGTH CONSENT FOR TELE-HEALTH VISIT   I hereby voluntarily request, consent and authorize [employer] and its employed or contracted physicians, physician assistants, nurse practitioners or other licensed health care professionals (the Practitioner), to provide me with telemedicine health care services (the Services") as deemed necessary by the treating Practitioner. I acknowledge and consent to receive the Services by the Practitioner via telemedicine. I understand that the telemedicine visit will involve communicating with the Practitioner through live audiovisual communication technology and the disclosure of certain medical information by electronic transmission. I acknowledge that I have been given the opportunity to request an in-person assessment or other available alternative prior to the telemedicine visit and am voluntarily participating in the telemedicine visit.  I understand that I have the right to withhold or withdraw my consent to the use of telemedicine in the course of my care at any time, without affecting my right to future care or treatment, and that the Practitioner or I  may terminate the telemedicine visit at any time. I understand that I have the right to inspect all information obtained and/or recorded in the course of the telemedicine visit and may receive copies of available information for a reasonable fee.  I understand that some of the potential risks of receiving the Services via telemedicine include:   Delay or interruption in medical evaluation due to technological equipment failure or disruption;  Information transmitted may not be sufficient (e.g. poor resolution of images) to allow for appropriate medical decision making by the Practitioner; and/or   In rare instances, security protocols could fail, causing a breach of personal health information.  Furthermore, I acknowledge that it is my responsibility to provide information about my medical history, conditions and care that is complete and accurate to the best of my ability. I acknowledge that Practitioner's advice, recommendations, and/or decision may be based on factors not within their control, such as incomplete or inaccurate data provided by me or distortions of diagnostic images or specimens that may result from electronic transmissions. I understand that the practice of medicine is not an exact  science and that Practitioner makes no warranties or guarantees regarding treatment outcomes. I acknowledge that I will receive a copy of this consent concurrently upon execution via email to the email address I last provided but may also request a printed copy by calling the office of [facility] [place].    I understand that my insurance will be billed for this visit.   I have read or had this consent read to me.  I understand the contents of this consent, which adequately explains the benefits and risks of the Services being provided via telemedicine.   I have been provided ample opportunity to ask questions regarding this consent and the Services and have had my questions answered to my satisfaction.  I  give my informed consent for the services to be provided through the use of telemedicine in my medical care  By participating in this telemedicine visit I agree to the above.

## 2018-09-15 ENCOUNTER — Telehealth (INDEPENDENT_AMBULATORY_CARE_PROVIDER_SITE_OTHER): Payer: 59 | Admitting: Cardiology

## 2018-09-15 ENCOUNTER — Other Ambulatory Visit: Payer: Self-pay

## 2018-09-15 ENCOUNTER — Telehealth: Payer: Self-pay | Admitting: *Deleted

## 2018-09-15 ENCOUNTER — Encounter: Payer: Self-pay | Admitting: Cardiology

## 2018-09-15 DIAGNOSIS — R9431 Abnormal electrocardiogram [ECG] [EKG]: Secondary | ICD-10-CM

## 2018-09-15 DIAGNOSIS — Z8679 Personal history of other diseases of the circulatory system: Secondary | ICD-10-CM

## 2018-09-15 DIAGNOSIS — E7800 Pure hypercholesterolemia, unspecified: Secondary | ICD-10-CM

## 2018-09-15 HISTORY — DX: Personal history of other diseases of the circulatory system: Z86.79

## 2018-09-15 NOTE — Progress Notes (Signed)
Virtual Visit via Video Note   This visit type was conducted due to national recommendations for restrictions regarding the COVID-19 Pandemic (e.g. social distancing) in an effort to limit this patient's exposure and mitigate transmission in our community.  Due to his co-morbid illnesses, this patient is at least at moderate risk for complications without adequate follow up.  This format is felt to be most appropriate for this patient at this time.  All issues noted in this document were discussed and addressed.  A limited physical exam was performed with this format.  Please refer to the patient's chart for his consent to telehealth for [facility].  Evaluation Performed:  Follow-up visit  This visit type was conducted due to national recommendations for restrictions regarding the COVID-19 Pandemic (e.g. social distancing).  This format is felt to be most appropriate for this patient at this time.  All issues noted in this document were discussed and addressed.  No physical exam was performed (except for noted visual exam findings with Video Visits).  Please refer to the patient's chart (MyChart message for video visits and phone note for telephone visits) for the patient's consent to telehealth for [facility].  Date:  09/15/2018  ID: [place], DOB 1956-03-04, MRN [id number]   Patient Location: [address] [name] [address]   Provider location:   [place] [facility]  PCP:  [name], PA-C  Cardiologist:  Jenne Campus, MD     Chief Complaint: Doing well  History of Present Illness:    [place] is a 63 y.o. male  who presents via audio/video conferencing for a telehealth visit today.  With history of abnormal EKG.  He presented to me with a concern about having hypertrophic cardiomyopathy.  He was able to provide some augmentation which showed similar EKG changes eventually went up doing echocardiogram which showed some questionable thickening of the apex  and then finally MRI was done which showed thickening of the apex which was very small and limited.  There was no late gadolinium enhancement overall that was considered low risk future.  He is doing great asymptomatic no dizziness no passing out try to be active exercise on the regular basis.  He relocated to [place] is very happy to be there for 6 keeping all the rules of coronavirus restrictions.  We talked at length today about his condition I told him this is probably mild volume but still will need to be vigilant.  I will make arrangements for event recorder to be sent to him he will wear 0 patch for 2 weeks.  I also told him that he need to have his children to be checked for at least EKG on diabetes and echocardiogram we will also start talking about genetic testing which does have fairly low yield in this kind of condition but may be beneficial for his situation and his children.  He said he will think it over.   The patient does not have symptoms concerning for COVID-19 infection (fever, chills, cough, or new SHORTNESS OF BREATH).    Prior CV studies:   The following studies were reviewed today:  MRI done in October 2019 showed: IMPRESSION: 1. Normal left ventricular size with EF 69%. There is prominent hypertrophy limited to a small area at the apical cap of the left ventricle. This may be a variant of apical hypertrophic cardiomyopathy (ECG would fit).  2.  Normal RV size and systolic function, EF 42%.  3. No myocardial LGE. Suspect this would be a relatively low risk apical HCM variant.      Past Medical History:  Diagnosis Date  . Androgen deficiency   . Elevated cholesterol   . Tinnitus     No past surgical history on file.   Current Meds  Medication Sig  . aspirin 81 MG tablet Take 81 mg by mouth daily.  Marland Kitchen atorvastatin (LIPITOR) 10 MG tablet   . fexofenadine (ALLEGRA) 180 MG tablet Take 180 mg by mouth as needed.   . Multiple Vitamin (MULTIVITAMIN) tablet  Take 1 tablet by mouth daily.  . nitroGLYCERIN (NITROSTAT) 0.4 MG SL tablet   . triamcinolone cream (KENALOG) 0.1 % Apply 1 application topically 2 (two) times daily.      Family History: The patient's family history includes Atrial fibrillation in his mother; CAD in his father; Heart failure in his mother; Hyperlipidemia in his father; Idiopathic pulmonary fibrosis in his sister; Multiple myeloma in his father; Narcolepsy in his mother.   ROS:   Please see the history of present illness.     All other systems reviewed and are negative.   Labs/Other Tests and Data Reviewed:     Recent Labs: 02/24/2018: BUN 21; Potassium 4.7; Sodium 145 02/26/2018: Creatinine, Ser 1.00  Recent Lipid Panel No results found for: CHOL, TRIG, HDL, CHOLHDL, VLDL, LDLCALC, LDLDIRECT    Exam:    Vital Signs:  There were no vitals taken for this visit.    Wt Readings from Last 3 Encounters:  02/24/18 188 lb 1.9 oz (85.3 kg)  01/14/18 186 lb 12.8 oz (84.7 kg)     Well nourished, well developed in no acute distress. Alert awake and x3 talking from his office at home they are happy to be able to see me no JVD no swelling of lower extremities  Diagnosis for this visit:   1. Abnormal EKG   2. Hx of hypertrophic cardiomyopathy   3. Elevated cholesterol      ASSESSMENT & PLAN:    1.  Hypertrophic cardiomyopathy apical variant without any gradient only very mild with MRI showing only minimal changes.  No late gadolinium enhancement noted.  Overall considered low risk.  He is asymptomatic.  I will ask him to wear monitor for 2 weeks make sure there is no significant arrhythmia we discussed need to check his children at this with EKG and echocardiogram. 2.  Abnormal EKG related to hypertrophic apical mild cardiomyopathy. 3.  Elevated cholesterol.  He is trying to change his diet and exercise more we recheck fasting lipid profile later.  COVID-19 Education: The signs and symptoms of COVID-19 were  discussed with the patient and how to seek care for testing (follow up with PCP or arrange E-visit).  The importance of social distancing was discussed today.  Patient Risk:   After full review of this patients clinical status, I feel that they are at least moderate risk at this time.  Time:   Today, I have spent 27 minutes with the patient with telehealth technology discussing pt health issues.  I spent 5 minutes reviewing her chart before the visit.  Visit was finished at 9:07 AM.    Medication Adjustments/Labs and Tests Ordered: Current medicines are reviewed at length with the patient today.  Concerns regarding medicines are outlined above.  No orders of the defined types were placed in this encounter.  Medication changes: No orders of the defined types were placed in this encounter.    Disposition:  Follow-up in 6 months.  Will send Zio patch for 2 weeks.  Signed, Park Liter, MD, [facility] 09/15/2018 9:09 AM    [place]

## 2018-09-15 NOTE — Addendum Note (Signed)
Addended by: Particia Nearing B on: 09/15/2018 02:07 PM   Modules accepted: Orders

## 2018-09-15 NOTE — Telephone Encounter (Signed)
Called pt to verify insurance coverage. He stated has ins. And will call me back when he gets home. Also wanted to know how much monitor will cost. Number he can call is [phone]: option 1, then option 4 for billing.

## 2018-09-15 NOTE — Addendum Note (Signed)
Addended by: Particia Nearing B on: 09/15/2018 09:30 AM   Modules accepted: Orders

## 2018-09-15 NOTE — Patient Instructions (Signed)
Medication Instructions:  Your physician recommends that you continue on your current medications as directed. Please refer to the Current Medication list given to you today.  If you need a refill on your cardiac medications before your next appointment, please call your pharmacy.   Lab work: None If you have labs (blood work) drawn today and your tests are completely normal, you will receive your results only by: Marland Kitchen MyChart Message (if you have MyChart) OR . A paper copy in the mail If you have any lab test that is abnormal or we need to change your treatment, we will call you to review the results.  Testing/Procedures: Your physician has recommended that you wear a holter monitor. Holter monitors are medical devices that record the heart's electrical activity. Doctors most often use these monitors to diagnose arrhythmias. Arrhythmias are problems with the speed or rhythm of the heartbeat. The monitor is a small, portable device. You can wear one while you do your normal daily activities. This is usually used to diagnose what is causing palpitations/syncope (passing out). WEAR 14 DAYS    Follow-Up: At [facility], you and your health needs are our priority.  As part of our continuing mission to provide you with exceptional heart care, we have created designated Provider Care Teams.  These Care Teams include your primary Cardiologist (physician) and Advanced Practice Providers (APPs -  Physician Assistants and Nurse Practitioners) who all work together to provide you with the care you need, when you need it. You will need a follow up appointment in 6 months.  Any Other Special Instructions Will Be Listed Below (If Applicable).

## 2018-09-16 ENCOUNTER — Other Ambulatory Visit: Payer: Self-pay | Admitting: *Deleted

## 2018-09-16 NOTE — Telephone Encounter (Signed)
Left msg for pt to call me back

## 2018-09-17 NOTE — Telephone Encounter (Signed)
Pt returned my call about insurance and stated he has a new job with better insurance and wants to wait until insurance goes through before getting this monitor. Should be by the end of May. Let him know that would be fine and to call me back when he has his new insurance.

## 2018-10-24 DIAGNOSIS — Z7901 Long term (current) use of anticoagulants: Secondary | ICD-10-CM

## 2018-10-24 HISTORY — DX: Long term (current) use of anticoagulants: Z79.01

## 2019-03-20 ENCOUNTER — Ambulatory Visit: Payer: Self-pay | Admitting: Cardiology

## 2019-06-03 IMAGING — CT CT HEART SCORING
2 series · 16 of 20 positions shown, 18 images · non-contrast
Comparison: None.

EXAM:
OVER-READ INTERPRETATION  CT CHEST

The following report is an over-read performed by radiologist Dr.
does not include interpretation of cardiac or coronary anatomy or
pathology. The coronary calcium score interpretation by the
cardiologist is attached.
CLINICAL DATA: Risk stratification
Coronary Calcium Score
TECHNIQUE: The patient was scanned on a Siemens Somatom 64 slice scanner. Axial
non-contrast 3mm slices were carried out through the heart. The data
set was analyzed on a dedicated work station and scored using the
Agatson method.

[Series 2: casc 3.0 i36f 2 bestdiast 72 % · axial · 0.41mm/px · z∈[-223,-133]mm · 8 of 40 slices shown, 10 images]
[im 5/40  vessel]
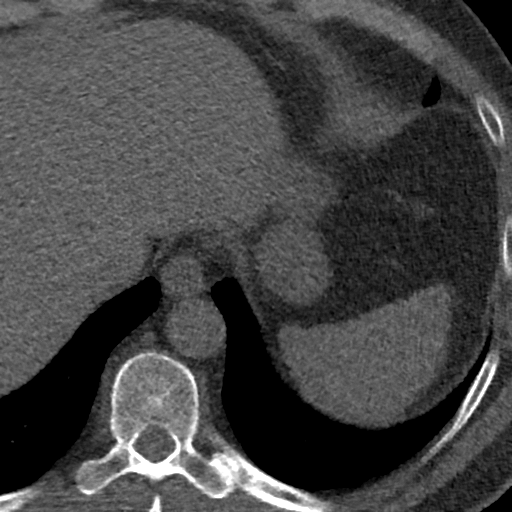
[im 5/40  lung]
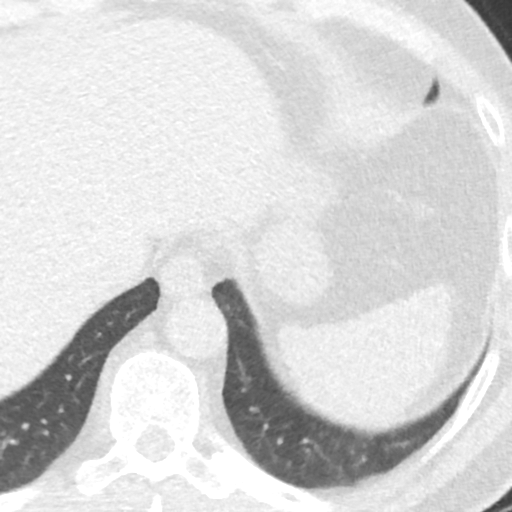
[im 9/40  vessel]
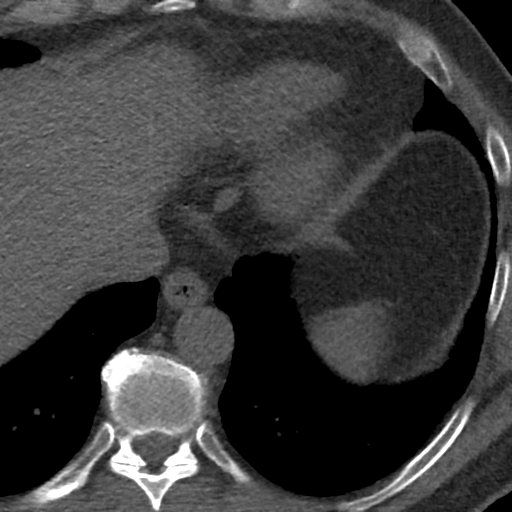
[im 14/40  vessel]
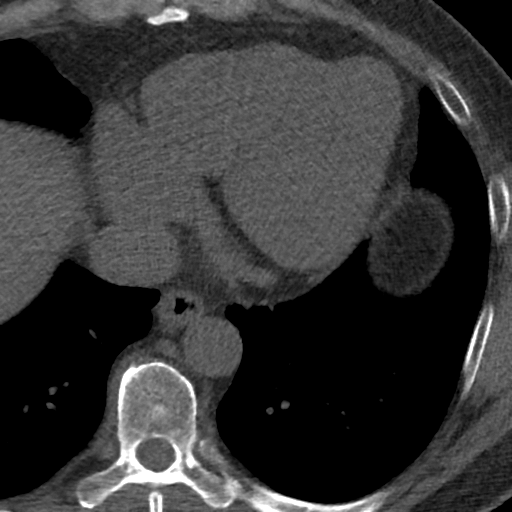
[im 18/40  vessel]
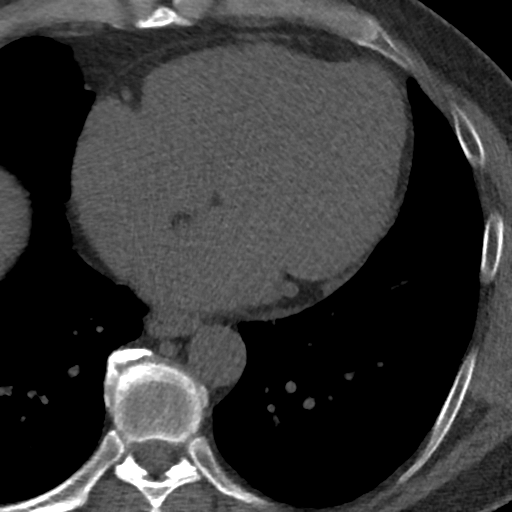
[im 22/40  vessel]
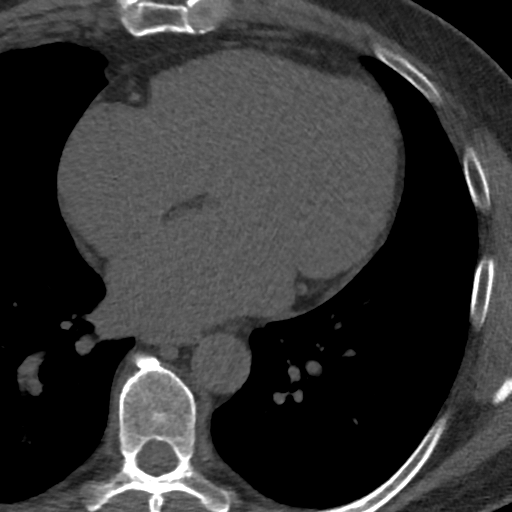
[im 22/40  lung]
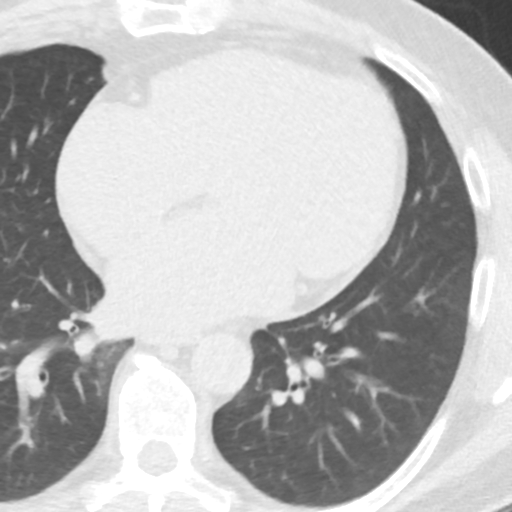
[im 27/40  vessel]
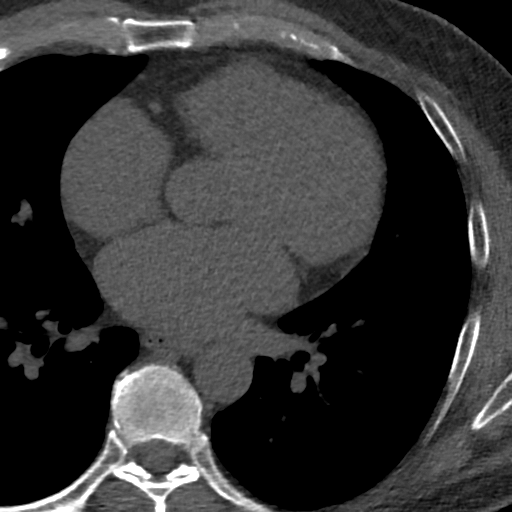
[im 31/40  vessel]
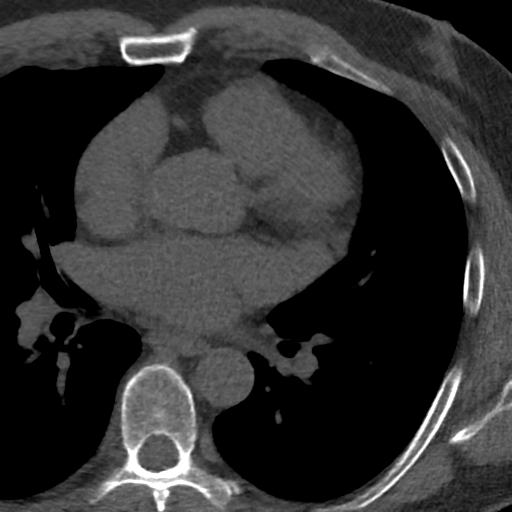
[im 35/40  vessel]
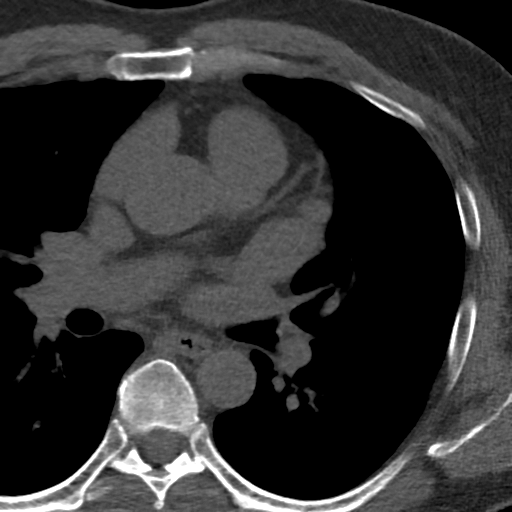

[Series 4: lung st 72 % · axial · 0.71mm/px · z∈[-222,-132]mm · 8 of 40 slices shown]
[im 5/40  lung]
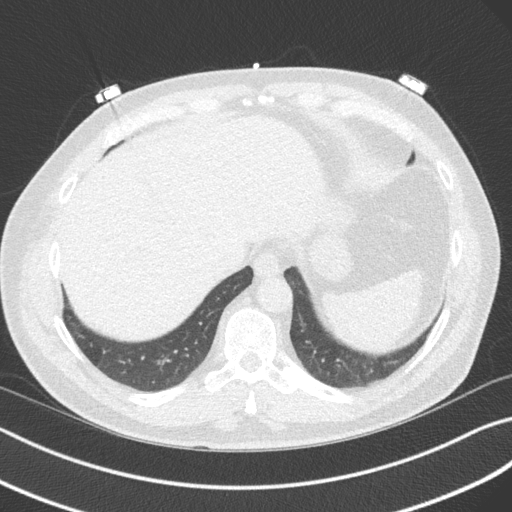
[im 9/40  lung]
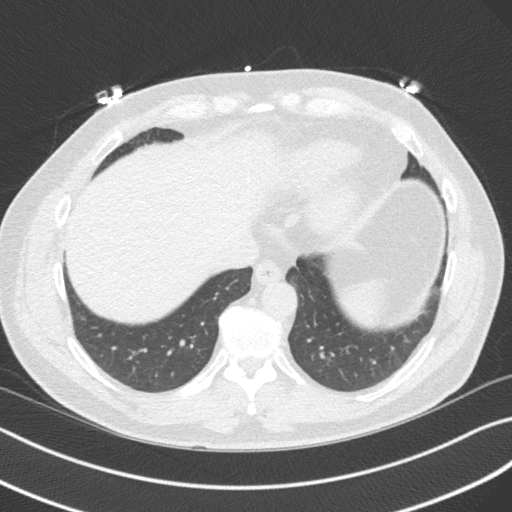
[im 14/40  lung]
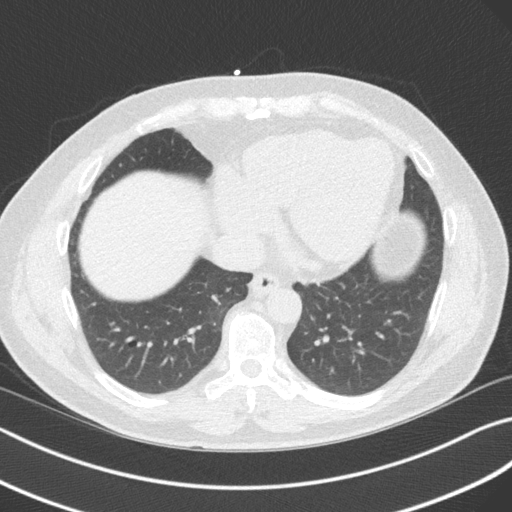
[im 18/40  lung]
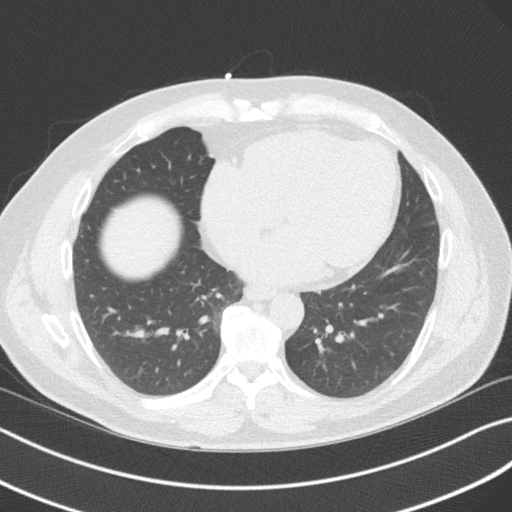
[im 22/40  lung]
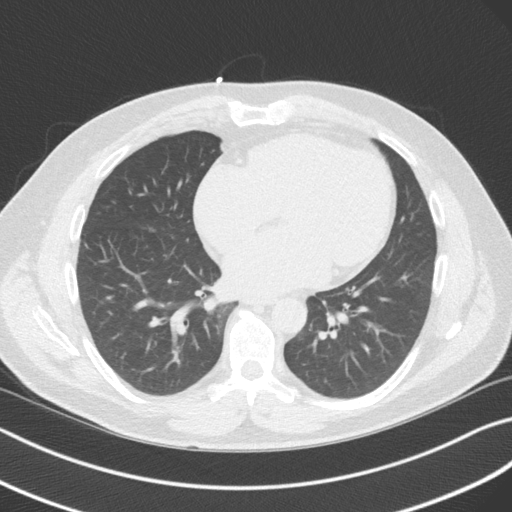
[im 27/40  lung]
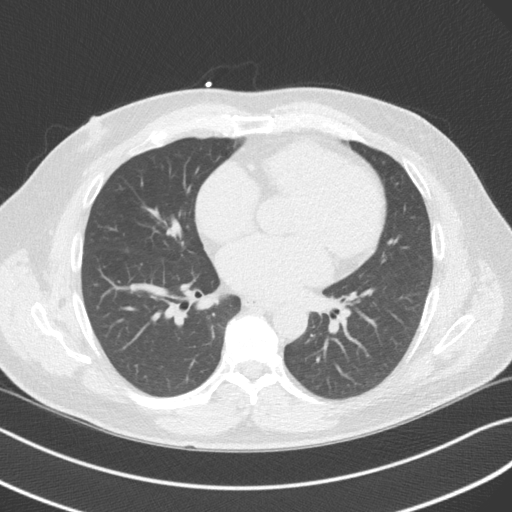
[im 31/40  lung]
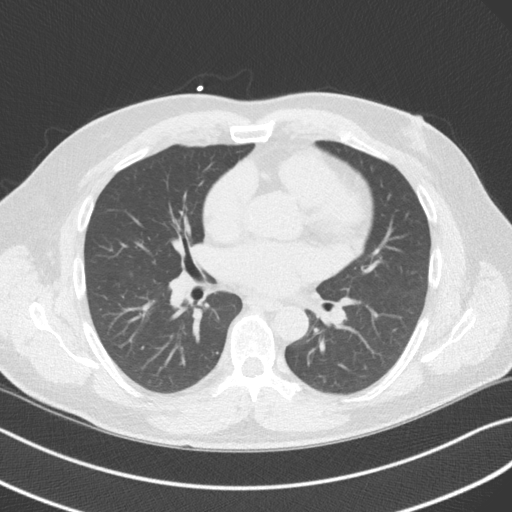
[im 35/40  lung]
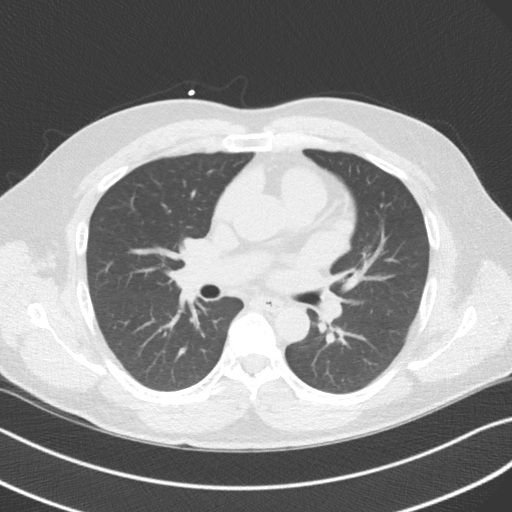

[16 of 20 positions shown; findings below may reference images not displayed]

FINDINGS: The noncardiac portions of the mediastinum are unremarkable.

There is no pleural effusion identified.  The lungs appear clear.

Visualized portions of the upper abdomen are unremarkable.

The visualized osseous structures are negative.
IMPRESSION: 1. Negative over-read.
FINDINGS: Non-cardiac: No significant non cardiac findings on limited lung and
soft tissue windows. See separate report from [REDACTED].

Ascending Aorta: Normal diameter 3.4 cm

Pericardium: Normal

Coronary arteries: No calcium detected
IMPRESSION: Coronary calcium score of 0.

Predseda Badakhsh

## 2019-07-22 MED FILL — ATORVASTATIN 10 MG TABLET: 10 | 90 days supply | Qty: 90 | Fill #0

## 2019-08-01 DIAGNOSIS — Z23 Encounter for immunization: Secondary | ICD-10-CM | POA: Diagnosis not present

## 2019-08-03 MED FILL — dilTIAZem HCL 60 MG TABS: 60 | 30 days supply | Qty: 90 | Fill #0

## 2019-08-05 MED FILL — XARELTO 20 MG TABLET: 20 | 30 days supply | Qty: 30 | Fill #0

## 2019-08-29 DIAGNOSIS — Z23 Encounter for immunization: Secondary | ICD-10-CM | POA: Diagnosis not present

## 2019-09-02 MED FILL — XARELTO 20 MG TABLET: 20 | 30 days supply | Qty: 30 | Fill #1

## 2019-09-02 MED FILL — dilTIAZem HCL 60 MG TABS: 60 | 30 days supply | Qty: 90 | Fill #1

## 2019-10-05 DIAGNOSIS — Z8679 Personal history of other diseases of the circulatory system: Secondary | ICD-10-CM | POA: Diagnosis not present

## 2019-10-05 DIAGNOSIS — E782 Mixed hyperlipidemia: Secondary | ICD-10-CM | POA: Diagnosis not present

## 2019-10-05 DIAGNOSIS — Z7901 Long term (current) use of anticoagulants: Secondary | ICD-10-CM | POA: Diagnosis not present

## 2019-10-05 DIAGNOSIS — I483 Typical atrial flutter: Secondary | ICD-10-CM | POA: Diagnosis not present

## 2019-10-05 MED FILL — XARELTO 20 MG TABLET: 20 | 30 days supply | Qty: 30 | Fill #2

## 2019-10-05 MED FILL — METOPROLOL SUCCINATE ER 25: 25 | 90 days supply | Qty: 90 | Fill #0

## 2019-10-05 MED FILL — dilTIAZem HCL 60 MG TABS: 60 | 10 days supply | Qty: 30 | Fill #2

## 2019-12-30 MED FILL — METOPROLOL SUCCINATE ER 25: 25 | 90 days supply | Qty: 90 | Fill #1

## 2020-01-05 ENCOUNTER — Telehealth: Payer: Self-pay

## 2020-01-05 ENCOUNTER — Telehealth (INDEPENDENT_AMBULATORY_CARE_PROVIDER_SITE_OTHER): Payer: 59 | Admitting: Cardiology

## 2020-01-05 ENCOUNTER — Encounter: Payer: Self-pay | Admitting: Cardiology

## 2020-01-05 VITALS — HR 57 | Ht 68.0 in | Wt 189.6 lb

## 2020-01-05 DIAGNOSIS — Z8679 Personal history of other diseases of the circulatory system: Secondary | ICD-10-CM | POA: Diagnosis not present

## 2020-01-05 DIAGNOSIS — E785 Hyperlipidemia, unspecified: Secondary | ICD-10-CM

## 2020-01-05 DIAGNOSIS — I4892 Unspecified atrial flutter: Secondary | ICD-10-CM | POA: Insufficient documentation

## 2020-01-05 HISTORY — DX: Hyperlipidemia, unspecified: E78.5

## 2020-01-05 HISTORY — DX: Unspecified atrial flutter: I48.92

## 2020-01-05 NOTE — Telephone Encounter (Signed)
  Patient Consent for Virtual Visit         [place] has provided verbal consent on 01/05/2020 for a virtual visit (video or telephone).   CONSENT FOR VIRTUAL VISIT FOR:  [place]  By participating in this virtual visit I agree to the following:  I hereby voluntarily request, consent and authorize [facility] and its employed or contracted physicians, physician assistants, nurse practitioners or other licensed health care professionals (the Practitioner), to provide me with telemedicine health care services (the "Services") as deemed necessary by the treating Practitioner. I acknowledge and consent to receive the Services by the Practitioner via telemedicine. I understand that the telemedicine visit will involve communicating with the Practitioner through live audiovisual communication technology and the disclosure of certain medical information by electronic transmission. I acknowledge that I have been given the opportunity to request an in-person assessment or other available alternative prior to the telemedicine visit and am voluntarily participating in the telemedicine visit.  I understand that I have the right to withhold or withdraw my consent to the use of telemedicine in the course of my care at any time, without affecting my right to future care or treatment, and that the Practitioner or I may terminate the telemedicine visit at any time. I understand that I have the right to inspect all information obtained and/or recorded in the course of the telemedicine visit and may receive copies of available information for a reasonable fee.  I understand that some of the potential risks of receiving the Services via telemedicine include:  Marland Kitchen Delay or interruption in medical evaluation due to technological equipment failure or disruption; . Information transmitted may not be sufficient (e.g. poor resolution of images) to allow for appropriate medical decision making by the Practitioner; and/or    . In rare instances, security protocols could fail, causing a breach of personal health information.  Furthermore, I acknowledge that it is my responsibility to provide information about my medical history, conditions and care that is complete and accurate to the best of my ability. I acknowledge that Practitioner's advice, recommendations, and/or decision may be based on factors not within their control, such as incomplete or inaccurate data provided by me or distortions of diagnostic images or specimens that may result from electronic transmissions. I understand that the practice of medicine is not an exact science and that Practitioner makes no warranties or guarantees regarding treatment outcomes. I acknowledge that a copy of this consent can be made available to me via my patient portal ([facility]), or I can request a printed copy by calling the office of [facility].    I understand that my insurance will be billed for this visit.   I have read or had this consent read to me. . I understand the contents of this consent, which adequately explains the benefits and risks of the Services being provided via telemedicine.  . I have been provided ample opportunity to ask questions regarding this consent and the Services and have had my questions answered to my satisfaction. . I give my informed consent for the services to be provided through the use of telemedicine in my medical care

## 2020-01-05 NOTE — Progress Notes (Signed)
Virtual Visit via Telephone Note   This visit type was conducted due to national recommendations for restrictions regarding the COVID-19 Pandemic (e.g. social distancing) in an effort to limit this patient's exposure and mitigate transmission in our community.  Due to his co-morbid illnesses, this patient is at least at moderate risk for complications without adequate follow up.  This format is felt to be most appropriate for this patient at this time.  The patient did not have access to video technology/had technical difficulties with video requiring transitioning to audio format only (telephone).  All issues noted in this document were discussed and addressed.  No physical exam could be performed with this format.  Please refer to the patient's chart for his  consent to telehealth for [place].  Evaluation Performed:  Follow-up visit  This visit type was conducted due to national recommendations for restrictions regarding the COVID-19 Pandemic (e.g. social distancing).  This format is felt to be most appropriate for this patient at this time.  All issues noted in this document were discussed and addressed.  No physical exam was performed (except for noted visual exam findings with Video Visits).  Please refer to the patient's chart (MyChart message for video visits and phone note for telephone visits) for the patient's consent to telehealth for [place].  Date:  01/05/2020  ID: [place], DOB 20-Jun-1955, MRN [id number]   Patient Location: [address] [place] [address]   Provider location:   [place]  PCP:  Evan Churn, PA-C  Cardiologist:  Evan Campus, MD     Chief Complaint: I have episode of atrial flutter and I do not feel well on medications  History of Present Illness:    [place] is a 64 y.o. male  who presents via audio/video conferencing for a telehealth visit today. With past medical history significant for apical variant  hypertrophic cardiomyopathy. I did see him last time in 2019 when he got quite extensive evaluation which included MRI. Likely MRI did not show any late gadolinium enhancement. He relocated to [place] he lives permanently over there. About a year ago he had episode of palpitation he was found to be in atrial flutter. He was very appropriately managed with anticoagulation, AV blocking agent in form of diltiazem has been giving and then he did have cardioversion. Since that time he seems to be in normal rhythm. However, he does not tolerate diltiazem well. He was given 60 mg 3 times daily and he said anytime he take this medication he feels absolutely miserable to the point that he reduce the dose of medication to only 1 tablet a day. Also this medication make him feel very tired and exhausted he cannot exercise as much as he wants to and he is disappointed with himself because he gained some significant weight. Otherwise seems to be doing well. Denies having any dizziness or passing out, no extraordinary shortness of breath. No swelling of lower extremities.   The patient does not have symptoms concerning for COVID-19 infection (fever, chills, cough, or new SHORTNESS OF BREATH).    Prior CV studies:   The following studies were reviewed today:       Past Medical History:  Diagnosis Date  . Androgen deficiency   . Elevated cholesterol   . Tinnitus     Past Surgical History:  Procedure Laterality Date  . CARDIOVERSION       Current Meds  Medication Sig  .  aspirin 81 MG tablet Take 81 mg by mouth daily.  Marland Kitchen atorvastatin (LIPITOR) 10 MG tablet Take 10 mg by mouth daily.  Marland Kitchen diltiazem (CARDIZEM) 60 MG tablet Take 60 mg by mouth at bedtime.  . fexofenadine (ALLEGRA) 180 MG tablet Take 180 mg by mouth as needed.   . Multiple Vitamin (MULTIVITAMIN) tablet Take 1 tablet by mouth daily.  . nitroGLYCERIN (NITROSTAT) 0.4 MG SL tablet   . Psyllium (METAMUCIL FIBER PO) Take 6 capsules by mouth  daily.  Marland Kitchen triamcinolone cream (KENALOG) 0.1 % Apply 1 application topically 2 (two) times daily.  . Vitamin D, Cholecalciferol, 50 MCG (2000 UT) CAPS Take 2,000 Units by mouth daily.  Alveda Reasons 20 MG TABS tablet Take 20 mg by mouth every other day.      Family History: The patient's family history includes Atrial fibrillation in his mother; CAD in his father; Heart failure in his mother; Hyperlipidemia in his father; Idiopathic pulmonary fibrosis in his sister; Multiple myeloma in his father; Narcolepsy in his mother.   ROS:   Please see the history of present illness.     All other systems reviewed and are negative.   Labs/Other Tests and Data Reviewed:     Recent Labs: No results found for requested labs within last 8760 hours.  Recent Lipid Panel No results found for: CHOL, TRIG, HDL, CHOLHDL, VLDL, LDLCALC, LDLDIRECT    Exam:    Vital Signs:  Pulse (!) 57   Ht '5\' 8"'  (1.727 m)   Wt 189 lb 9.6 oz (86 kg)   SpO2 96%   BMI 28.83 kg/m     Wt Readings from Last 3 Encounters:  01/05/20 189 lb 9.6 oz (86 kg)  02/24/18 188 lb 1.9 oz (85.3 kg)  01/14/18 186 lb 12.8 oz (84.7 kg)     Well nourished, well developed in no acute distress. Alert awake until 3 we talked over the phone, I were unable to establish video link. He is not in any distress quite cheerful happy to be able to talk to me. Denies have any complaint at the moment of our interview.  Diagnosis for this visit:   1. Hx of hypertrophic cardiomyopathy   2. Paroxysmal atrial flutter (HCC)   3. Dyslipidemia      ASSESSMENT & PLAN:    1. Hypertrophic cardiomyopathy, apical variant, luckily he does not have any worrisome features, there is no family history of premature sudden cardiac death, there is no dizziness no passing out, we will schedule him to have an echocardiogram and he would like to have it done [place] living in [place] but he is here every Friday  and he would like to have it done here. I do not see any indication to repeat MRI at this stage. He was also told that he needs stress test, will decide about this after we get echocardiogram. 2. Paroxysmal atrial flutter. So far 1 documented episode of atrial flutter. He was given calcium channel blocker, however, he cannot tolerate this medication. I explained to him the mechanism of atrial flutter and I told him that anticoagulation is absolutely essential. He does have hypertrophic cardiomyopathy therefore, in this situation chads 2 Vascor is not valid. He simply need to be anticoagulated. He understand and he will continue with Xarelto. Intensive AV suppressing agent. He simply cannot tolerate calcium channel blocker and asked to stop this medication. He does have prescription for Toprol-XL, however, he is  afraid to take it. I told him in this situation the best approach is simply continue anticoagulation have Cardizem handy just in case he had another episode of atrial flutter he need to take it at the time that he got palpitations. We also discussed the issue of atrial flutter ablation as an option. He is not too eager to pursue this and I told him that the best approach is simply wait and see what happened, if you have another episode of atrial flutter then atrial flutter ablation will be strongly recommended. The most important issue is continuation of anticoagulation. 3. Dyslipidemia: labs reviewed from 2020, will make arrangements for [place]  COVID-19 Education: The signs and symptoms of COVID-19 were discussed with the patient and how to seek care for testing (follow up with PCP or arrange E-visit).  The importance of social distancing was discussed today.  Patient Risk:   After full review of this patients clinical status, I feel that they are at least moderate risk at this time.  Time:   Today, I have spent 15 minutes with the patient with telehealth technology discussing pt health issues.  I  spent 17mnutes reviewing her chart before the visit.  Visit was finished at 11:30AM.    Medication Adjustments/Labs and Tests Ordered: Current medicines are reviewed at length with the patient today.  Concerns regarding medicines are outlined above.  No orders of the defined types were placed in this encounter.  Medication changes: No orders of the defined types were placed in this encounter.    Disposition:  3 months  Signed, RPark Liter MD, FSan [facility] System8/17/2021 11:57 AM    [place]

## 2020-01-05 NOTE — Patient Instructions (Signed)
Medication Instructions:  Your physician has recommended you make the following change in your medication:   STOP: Ditiazem   *If you need a refill on your cardiac medications before your next appointment, please call your pharmacy*   Lab Work: None. If you have labs (blood work) drawn today and your tests are completely normal, you will receive your results only by: Marland Kitchen MyChart Message (if you have MyChart) OR . A paper copy in the mail If you have any lab test that is abnormal or we need to change your treatment, we will call you to review the results.   Testing/Procedures: Your physician has requested that you have an echocardiogram. Echocardiography is a painless test that uses sound waves to create images of your heart. It provides your doctor with information about the size and shape of your heart and how well your heart's chambers and valves are working. This procedure takes approximately one hour. There are no restrictions for this procedure.     Follow-Up: At [facility], you and your health needs are our priority.  As part of our continuing mission to provide you with exceptional heart care, we have created designated Provider Care Teams.  These Care Teams include your primary Cardiologist (physician) and Advanced Practice Providers (APPs -  Physician Assistants and Nurse Practitioners) who all work together to provide you with the care you need, when you need it.  We recommend signing up for the patient portal called "MyChart".  Sign up information is provided on this After Visit Summary.  MyChart is used to connect with patients for Virtual Visits (Telemedicine).  Patients are able to view lab/test results, encounter notes, upcoming appointments, etc.  Non-urgent messages can be sent to your provider as well.   To learn more about what you can do with MyChart, go to [email or web address].    Your next appointment:   2 month(s)  The format for your next appointment:   In  Person  Provider:   Jenne Campus, MD   Other Instructions   Echocardiogram An echocardiogram is a procedure that uses painless sound waves (ultrasound) to produce an image of the heart. Images from an echocardiogram can provide important information about:  Signs of coronary artery disease (CAD).  Aneurysm detection. An aneurysm is a weak or damaged part of an artery wall that bulges out from the normal force of blood pumping through the body.  Heart size and shape. Changes in the size or shape of the heart can be associated with certain conditions, including heart failure, aneurysm, and CAD.  Heart muscle function.  Heart valve function.  Signs of a past heart attack.  Fluid buildup around the heart.  Thickening of the heart muscle.  A tumor or infectious growth around the heart valves. Tell a health care provider about:  Any allergies you have.  All medicines you are taking, including vitamins, herbs, eye drops, creams, and over-the-counter medicines.  Any blood disorders you have.  Any surgeries you have had.  Any medical conditions you have.  Whether you are pregnant or may be pregnant. What are the risks? Generally, this is a safe procedure. However, problems may occur, including:  Allergic reaction to dye (contrast) that may be used during the procedure. What happens before the procedure? No specific preparation is needed. You may eat and drink normally. What happens during the procedure?   An IV tube may be inserted into one of your veins.  You may receive contrast through this tube. A contrast  is an injection that improves the quality of the pictures from your heart.  A gel will be applied to your chest.  A wand-like tool (transducer) will be moved over your chest. The gel will help to transmit the sound waves from the transducer.  The sound waves will harmlessly bounce off of your heart to allow the heart images to be captured in real-time  motion. The images will be recorded on a computer. The procedure may vary among health care providers and hospitals. What happens after the procedure?  You may return to your normal, everyday life, including diet, activities, and medicines, unless your health care provider tells you not to do that. Summary  An echocardiogram is a procedure that uses painless sound waves (ultrasound) to produce an image of the heart.  Images from an echocardiogram can provide important information about the size and shape of your heart, heart muscle function, heart valve function, and fluid buildup around your heart.  You do not need to do anything to prepare before this procedure. You may eat and drink normally.  After the echocardiogram is completed, you may return to your normal, everyday life, unless your health care provider tells you not to do that. This information is not intended to replace advice given to you by your health care provider. Make sure you discuss any questions you have with your health care provider. Document Revised: 08/28/2018 Document Reviewed: 06/09/2016 Elsevier Patient Education  [place].

## 2020-01-26 ENCOUNTER — Other Ambulatory Visit: Payer: Self-pay | Admitting: Cardiology

## 2020-01-26 MED ORDER — XARELTO 20 MG PO TABS: 20.0000 mg | ORAL_TABLET | ORAL | 3 refills | Status: DC

## 2020-01-26 MED ORDER — ATORVASTATIN CALCIUM 10 MG PO TABS: 10.0000 mg | ORAL_TABLET | Freq: Every day | ORAL | 3 refills | Status: DC

## 2020-01-26 MED FILL — ATORVASTATIN CALCIUM 10 MG: 10 | 90 days supply | Qty: 90 | Fill #0

## 2020-01-26 MED FILL — XARELTO 20 MG TABLET: 20 | 90 days supply | Qty: 45 | Fill #0

## 2020-01-29 ENCOUNTER — Other Ambulatory Visit: Payer: Self-pay

## 2020-01-29 ENCOUNTER — Ambulatory Visit (HOSPITAL_BASED_OUTPATIENT_CLINIC_OR_DEPARTMENT_OTHER)
Admission: RE | Admit: 2020-01-29 | Discharge: 2020-01-29 | Disposition: A | Discharge status: Home / Self Care | Payer: 59 | Source: Ambulatory Visit | Attending: Cardiology | Admitting: Cardiology

## 2020-01-29 DIAGNOSIS — I422 Other hypertrophic cardiomyopathy: Secondary | ICD-10-CM | POA: Diagnosis not present

## 2020-01-29 DIAGNOSIS — Z8679 Personal history of other diseases of the circulatory system: Secondary | ICD-10-CM | POA: Diagnosis not present

## 2020-01-29 DIAGNOSIS — I4891 Unspecified atrial fibrillation: Secondary | ICD-10-CM

## 2020-01-29 LAB — ECHOCARDIOGRAM COMPLETE
AR max vel: 2.25 cm2
AV Area VTI: 2.37 cm2
AV Area mean vel: 2.29 cm2
AV Mean grad: 4 mmHg
AV Peak grad: 9.6 mmHg
Ao pk vel: 1.55 m/s
Area-P 1/2: 3.63 cm2
S' Lateral: 2.79 cm

## 2020-02-23 DIAGNOSIS — Z79899 Other long term (current) drug therapy: Secondary | ICD-10-CM | POA: Diagnosis not present

## 2020-02-23 DIAGNOSIS — R739 Hyperglycemia, unspecified: Secondary | ICD-10-CM | POA: Diagnosis not present

## 2020-02-23 DIAGNOSIS — E782 Mixed hyperlipidemia: Secondary | ICD-10-CM | POA: Diagnosis not present

## 2020-02-23 DIAGNOSIS — R3129 Other microscopic hematuria: Secondary | ICD-10-CM | POA: Diagnosis not present

## 2020-02-24 ENCOUNTER — Other Ambulatory Visit (HOSPITAL_COMMUNITY): Payer: Self-pay | Admitting: Physician Assistant

## 2020-02-24 MED FILL — FLUTICASONE PROP 50 MCG SPR: 50 | 60 days supply | Qty: 16 | Fill #0

## 2020-03-02 DIAGNOSIS — I483 Typical atrial flutter: Secondary | ICD-10-CM | POA: Diagnosis not present

## 2020-03-02 DIAGNOSIS — Z0000 Encounter for general adult medical examination without abnormal findings: Secondary | ICD-10-CM | POA: Diagnosis not present

## 2020-03-02 DIAGNOSIS — E782 Mixed hyperlipidemia: Secondary | ICD-10-CM | POA: Diagnosis not present

## 2020-03-02 DIAGNOSIS — R3129 Other microscopic hematuria: Secondary | ICD-10-CM | POA: Diagnosis not present

## 2020-03-02 DIAGNOSIS — Z23 Encounter for immunization: Secondary | ICD-10-CM | POA: Diagnosis not present

## 2020-03-02 DIAGNOSIS — E291 Testicular hypofunction: Secondary | ICD-10-CM | POA: Diagnosis not present

## 2020-03-02 DIAGNOSIS — Z7901 Long term (current) use of anticoagulants: Secondary | ICD-10-CM | POA: Diagnosis not present

## 2020-03-02 DIAGNOSIS — Z8679 Personal history of other diseases of the circulatory system: Secondary | ICD-10-CM | POA: Diagnosis not present

## 2020-03-15 ENCOUNTER — Other Ambulatory Visit: Payer: Self-pay | Admitting: Cardiology

## 2020-03-15 MED ORDER — XARELTO 20 MG PO TABS: 20.0000 mg | ORAL_TABLET | Freq: Every day | ORAL | 1 refills | Status: DC

## 2020-03-18 ENCOUNTER — Ambulatory Visit (INDEPENDENT_AMBULATORY_CARE_PROVIDER_SITE_OTHER): Payer: 59 | Admitting: Cardiology

## 2020-03-18 ENCOUNTER — Other Ambulatory Visit: Payer: Self-pay

## 2020-03-18 ENCOUNTER — Encounter: Payer: Self-pay | Admitting: Cardiology

## 2020-03-18 VITALS — BP 116/74 | HR 67 | Ht 68.0 in | Wt 192.0 lb

## 2020-03-18 DIAGNOSIS — Z8679 Personal history of other diseases of the circulatory system: Secondary | ICD-10-CM | POA: Diagnosis not present

## 2020-03-18 DIAGNOSIS — E785 Hyperlipidemia, unspecified: Secondary | ICD-10-CM

## 2020-03-18 DIAGNOSIS — I4892 Unspecified atrial flutter: Secondary | ICD-10-CM | POA: Diagnosis not present

## 2020-03-18 MED FILL — XARELTO 20 MG TABLET: 20 | 90 days supply | Qty: 90 | Fill #0

## 2020-03-18 NOTE — Patient Instructions (Signed)

## 2020-03-18 NOTE — Progress Notes (Signed)
Cardiology Office Note:    Date:  03/18/2020   ID:  Evan Russell, DOB 15-Jun-1955, MRN [id number]  PCP:  [name], PA-C  Cardiologist:  Jenne Campus, MD    Referring MD: [name], PA-C   No chief complaint on file. I am doing great  History of Present Illness:    Evan Russell is a 64 y.o. male with past medical history significant for apical variant of hypertrophic cardiomyopathy, there is no obstruction.  Initially diagnosed with 2019 by MRI.  Likely MRI did not show any late gadolinium enhancement.  He does not have any recent palpitations there is no dizziness there is no passing out.  About a year ago he did suffer from episode of atrial flutter.  After that he was put on Xarelto and continue taking Xarelto since then.  He denies having any more palpitation he denies having any episode of dizziness or passing out.  He goes to gym on the regular basis and do some weightlifting as well as walking fast on the treadmill.  I have no difficulty doing it  Past Medical History:  Diagnosis Date  . Abnormal EKG 01/14/2018  . Androgen deficiency   . Chronic anticoagulation 10/24/2018  . Dyslipidemia 01/05/2020  . Elevated cholesterol   . Hx of hypertrophic cardiomyopathy 09/15/2018   Minimal thickening of the apex with abnormal EKG based on the MRI  . Paroxysmal atrial flutter ([place]) 01/05/2020  . Tinnitus     Past Surgical History:  Procedure Laterality Date  . CARDIOVERSION      Current Medications: Current Meds  Medication Sig  . atorvastatin (LIPITOR) 10 MG tablet Take 1 tablet (10 mg total) by mouth daily.  . fexofenadine (ALLEGRA) 180 MG tablet Take 180 mg by mouth as needed.   . Multiple Vitamin (MULTIVITAMIN) tablet Take 1 tablet by mouth daily.  . nitroGLYCERIN (NITROSTAT) 0.4 MG SL tablet   . Psyllium (METAMUCIL FIBER PO) Take 6 capsules by mouth daily.  . Vitamin D, Cholecalciferol, 50 MCG (2000 UT) CAPS Take 2,000 Units by mouth daily.  Alveda Reasons 20 MG TABS  tablet Take 1 tablet (20 mg total) by mouth daily.     Allergies:   Patient has no known allergies.   Social History   Socioeconomic History  . Marital status: Married    Spouse name: Not on file  . Number of children: Not on file  . Years of education: Not on file  . Highest education level: Not on file  Occupational History  . Not on file  Tobacco Use  . Smoking status: Never Smoker  . Smokeless tobacco: Never Used  Substance and Sexual Activity  . Alcohol use: Yes    Alcohol/week: 2.0 standard drinks    Types: 2 Glasses of wine per week    Comment: 2 glasses a day   . Drug use: Not on file  . Sexual activity: Not on file  Other Topics Concern  . Not on file  Social History Narrative  . Not on file   Social Determinants of Health   Financial Resource Strain:   . Difficulty of Paying Living Expenses: Not on file  Food Insecurity:   . Worried About Charity fundraiser in the Last Year: Not on file  . Ran Out of Food in the Last Year: Not on file  Transportation Needs:   . Lack of Transportation (Medical): Not on file  . Lack of Transportation (Non-Medical): Not on file  Physical Activity:   . Days of Exercise per Week: Not on file  . Minutes of Exercise per Session: Not on file  Stress:   . Feeling of Stress : Not on file  Social Connections:   . Frequency of Communication with Friends and Family: Not on file  . Frequency of Social Gatherings with Friends and Family: Not on file  . Attends Religious Services: Not on file  . Active Member of Clubs or Organizations: Not on file  . Attends Archivist Meetings: Not on file  . Marital Status: Not on file     Family History: The patient's family history includes Atrial fibrillation in his mother; CAD in his father; Heart failure in his mother; Hyperlipidemia in his father; Idiopathic pulmonary fibrosis in his sister; Multiple myeloma in his father; Narcolepsy in his mother. ROS:   Please see the history of  present illness.    All 14 point review of systems negative except as described per history of present illness  EKGs/Labs/Other Studies Reviewed:      Recent Labs: No results found for requested labs within last 8760 hours.  Recent Lipid Panel No results found for: CHOL, TRIG, HDL, CHOLHDL, VLDL, LDLCALC, LDLDIRECT  Physical Exam:    VS:  BP 116/74   Pulse 67   Ht '5\' 8"'  (1.727 m)   Wt 192 lb (87.1 kg)   SpO2 97%   BMI 29.19 kg/m     Wt Readings from Last 3 Encounters:  03/18/20 192 lb (87.1 kg)  01/05/20 189 lb 9.6 oz (86 kg)  02/24/18 188 lb 1.9 oz (85.3 kg)     GEN:  Well nourished, well developed in no acute distress HEENT: Normal NECK: No JVD; No carotid bruits LYMPHATICS: No lymphadenopathy CARDIAC: RRR, no murmurs, no rubs, no gallops RESPIRATORY:  Clear to auscultation without rales, wheezing or rhonchi  ABDOMEN: Soft, non-tender, non-distended MUSCULOSKELETAL:  No edema; No deformity  SKIN: Warm and dry LOWER EXTREMITIES: no swelling NEUROLOGIC:  Alert and oriented x 3 PSYCHIATRIC:  Normal affect   ASSESSMENT:    1. Paroxysmal atrial flutter ([place])   2. Hx of hypertrophic cardiomyopathy   3. Dyslipidemia    PLAN:    In order of problems listed above:  1. Paroxysmal atrial flutter.  No palpitations since last episode .  Continue anticoagulation, and he did not like when he was taking his Cardizem.  He does have Cardizem to take it on as-needed basis if he developed episodes of palpitations.  Because of hypertrophic cardiomyopathy he is anticoagulated which I will continue.  We did not calculate CHA2DS2-VASc score in somebody with hypertrophic cardiomyopathy. 2. Hypertrophic cardiomyopathy: Apical variant, no late enhancement on MRI.  No family history of premature cardiac death, no palpitations except episodes of atrial flutter in his situation.  There is no indication for stress testing since there is no significant obstruction.  Therefore we will not see  any change in his blood pressure that would indicate need for ICD.  Echocardiogram reviewed with him, no new changes.  Continue present management. 3. Dyslipidemia: He is taking Lipitor which I will continue.  I will try to get report of his fasting lipid profile. 4. Low testosterone.  He has been question if it safe for him to take testosterone.  In my opinion this is the situation of risk and benefits.  Overall there is small risk of test to testosterone from heart point of view.  I simply advised him to start taking  it and see if he feels any better if he feels significantly better than he should probably continue with understanding that does carry some risk if there is no improvement in the way he feels there is no point of taking testosterone my opinion.   Medication Adjustments/Labs and Tests Ordered: Current medicines are reviewed at length with the patient today.  Concerns regarding medicines are outlined above.  Orders Placed This Encounter  Procedures  . EKG 12-Lead   Medication changes: No orders of the defined types were placed in this encounter.   Signed, Park Liter, MD, [facility] 03/18/2020 1:58 PM    [place]

## 2020-04-19 MED FILL — ATORVASTATIN CALCIUM 10 MG: 10 | 90 days supply | Qty: 90 | Fill #1

## 2020-04-19 MED FILL — FLUTICASONE PROP 50 MCG SPR: 50 | 60 days supply | Qty: 16 | Fill #1

## 2020-05-05 ENCOUNTER — Telehealth: Payer: 59 | Admitting: Emergency Medicine

## 2020-05-05 ENCOUNTER — Other Ambulatory Visit: Payer: Self-pay | Admitting: Emergency Medicine

## 2020-05-05 DIAGNOSIS — J329 Chronic sinusitis, unspecified: Secondary | ICD-10-CM | POA: Diagnosis not present

## 2020-05-05 MED ORDER — AMOXICILLIN-POT CLAVULANATE 875-125 MG PO TABS: 1.0000 | ORAL_TABLET | Freq: Two times a day (BID) | ORAL | 0 refills | Status: DC

## 2020-05-05 MED ORDER — SALINE SPRAY 0.65 % NA SOLN: 1.0000 | NASAL | 0 refills | Status: DC | PRN

## 2020-05-05 MED FILL — AMOX-CLAV 875-125 MG TABLET: 875-125 | 7 days supply | Qty: 14 | Fill #0

## 2020-05-05 NOTE — Progress Notes (Signed)

## 2020-05-06 ENCOUNTER — Ambulatory Visit: Payer: 59

## 2020-05-11 DIAGNOSIS — H524 Presbyopia: Secondary | ICD-10-CM | POA: Diagnosis not present

## 2020-06-06 MED FILL — XARELTO 20 MG TABLET: 20 | 90 days supply | Qty: 45 | Fill #1

## 2020-06-20 MED FILL — FLUTICASONE PROP 50 MCG SPR: 50 | 60 days supply | Qty: 16 | Fill #2

## 2020-07-14 MED FILL — ATORVASTATIN CALCIUM 10 MG: 10 | 90 days supply | Qty: 90 | Fill #2

## 2020-07-28 ENCOUNTER — Other Ambulatory Visit (HOSPITAL_COMMUNITY): Payer: Self-pay | Admitting: Pharmacist

## 2020-07-28 MED FILL — CARESTART COVID-19 HOME TES: 4 days supply | Qty: 4 | Fill #0

## 2020-07-28 MED FILL — XARELTO 20 MG TABLET: 20 | 90 days supply | Qty: 90 | Fill #1

## 2020-08-12 ENCOUNTER — Other Ambulatory Visit (HOSPITAL_BASED_OUTPATIENT_CLINIC_OR_DEPARTMENT_OTHER): Payer: Self-pay

## 2020-08-16 MED FILL — FLUTICASONE PROP 50 MCG SPR: 50 | 60 days supply | Qty: 16 | Fill #3

## 2020-08-25 ENCOUNTER — Other Ambulatory Visit (HOSPITAL_COMMUNITY): Payer: Self-pay

## 2020-08-31 ENCOUNTER — Other Ambulatory Visit (HOSPITAL_COMMUNITY): Payer: Self-pay

## 2020-09-21 DIAGNOSIS — E291 Testicular hypofunction: Secondary | ICD-10-CM | POA: Diagnosis not present

## 2020-09-21 DIAGNOSIS — Z79899 Other long term (current) drug therapy: Secondary | ICD-10-CM | POA: Diagnosis not present

## 2020-09-21 DIAGNOSIS — E782 Mixed hyperlipidemia: Secondary | ICD-10-CM | POA: Diagnosis not present

## 2020-09-28 ENCOUNTER — Other Ambulatory Visit (HOSPITAL_COMMUNITY): Payer: Self-pay

## 2020-09-28 DIAGNOSIS — Z7901 Long term (current) use of anticoagulants: Secondary | ICD-10-CM | POA: Diagnosis not present

## 2020-09-28 DIAGNOSIS — E291 Testicular hypofunction: Secondary | ICD-10-CM | POA: Diagnosis not present

## 2020-09-28 DIAGNOSIS — Z8679 Personal history of other diseases of the circulatory system: Secondary | ICD-10-CM | POA: Diagnosis not present

## 2020-09-28 DIAGNOSIS — R059 Cough, unspecified: Secondary | ICD-10-CM | POA: Diagnosis not present

## 2020-09-28 DIAGNOSIS — Z79899 Other long term (current) drug therapy: Secondary | ICD-10-CM | POA: Diagnosis not present

## 2020-09-28 DIAGNOSIS — E782 Mixed hyperlipidemia: Secondary | ICD-10-CM | POA: Diagnosis not present

## 2020-09-28 DIAGNOSIS — I483 Typical atrial flutter: Secondary | ICD-10-CM | POA: Diagnosis not present

## 2020-09-28 MED ORDER — TESTOSTERONE CYPIONATE 100 MG/ML IM SOLN
INTRAMUSCULAR | 0 refills | Status: DC
  Filled 2020-09-28: qty 10, 28d supply, fill #0

## 2020-10-03 ENCOUNTER — Other Ambulatory Visit (HOSPITAL_COMMUNITY): Payer: Self-pay

## 2020-10-04 ENCOUNTER — Other Ambulatory Visit (HOSPITAL_COMMUNITY): Payer: Self-pay

## 2020-10-05 ENCOUNTER — Other Ambulatory Visit (HOSPITAL_COMMUNITY): Payer: Self-pay

## 2020-10-06 ENCOUNTER — Other Ambulatory Visit (HOSPITAL_COMMUNITY): Payer: Self-pay

## 2020-10-11 DIAGNOSIS — E291 Testicular hypofunction: Secondary | ICD-10-CM | POA: Diagnosis not present

## 2020-10-12 ENCOUNTER — Other Ambulatory Visit: Payer: Self-pay | Admitting: Cardiology

## 2020-10-12 ENCOUNTER — Other Ambulatory Visit (HOSPITAL_COMMUNITY): Payer: Self-pay

## 2020-10-12 MED ORDER — XARELTO 20 MG PO TABS
20.0000 mg | ORAL_TABLET | Freq: Every day | ORAL | 1 refills | Status: DC
  Filled 2020-10-12: qty 90, 90d supply, fill #0
  Filled 2021-01-20: qty 90, 90d supply, fill #1

## 2020-10-12 MED FILL — Atorvastatin Calcium Tab 10 MG (Base Equivalent): ORAL | 90 days supply | Qty: 90 | Fill #0 | Status: AC

## 2020-10-12 NOTE — Telephone Encounter (Signed)
41M, 87.1KG, SCR 1.0 09/21/20, LOVW/[name] 03/18/20, CCR 91 mL/min Creatinine clearance, original Cockcroft-Gault

## 2020-10-14 ENCOUNTER — Ambulatory Visit: Payer: 59 | Admitting: Cardiology

## 2020-10-15 ENCOUNTER — Other Ambulatory Visit (HOSPITAL_COMMUNITY): Payer: Self-pay

## 2020-10-26 DIAGNOSIS — E291 Testicular hypofunction: Secondary | ICD-10-CM | POA: Diagnosis not present

## 2020-11-09 ENCOUNTER — Other Ambulatory Visit: Payer: Self-pay

## 2020-11-09 DIAGNOSIS — E291 Testicular hypofunction: Secondary | ICD-10-CM | POA: Diagnosis not present

## 2020-11-11 ENCOUNTER — Ambulatory Visit: Payer: 59 | Admitting: Cardiology

## 2020-11-11 ENCOUNTER — Other Ambulatory Visit: Payer: Self-pay

## 2020-11-11 VITALS — BP 124/68 | HR 61 | Ht 68.0 in | Wt 197.0 lb

## 2020-11-11 DIAGNOSIS — I422 Other hypertrophic cardiomyopathy: Secondary | ICD-10-CM

## 2020-11-11 DIAGNOSIS — I4892 Unspecified atrial flutter: Secondary | ICD-10-CM | POA: Diagnosis not present

## 2020-11-11 DIAGNOSIS — Z7901 Long term (current) use of anticoagulants: Secondary | ICD-10-CM

## 2020-11-11 DIAGNOSIS — E785 Hyperlipidemia, unspecified: Secondary | ICD-10-CM

## 2020-11-11 DIAGNOSIS — Z8679 Personal history of other diseases of the circulatory system: Secondary | ICD-10-CM | POA: Diagnosis not present

## 2020-11-11 NOTE — Progress Notes (Signed)
Cardiology Office Note:    Date:  11/11/2020   ID:  Evan Russell, DOB 1956/05/20, MRN [id number]  PCP:  [name], PA-C  Cardiologist:  Jenne Campus, MD    Referring MD: [name], PA-C   Chief Complaint  Patient presents with   Follow-up  I am doing well  History of Present Illness:    Evan Russell is a 65 y.o. male with past medical history significant for apical variant hypertrophic cardiomyopathy, there is no LV obstruction, initially diagnosed based on MRI from 2019.  Likely MRI did not show any late gadolinium enhancement.  He also got episode of atrial fibrillation because of hypertrophic cardiomyopathy in spite of his CHADS2 V[place]or being only 1 he was put on anticoagulation.  He comes today to my office for follow-up.  Overall he is doing great.  He is asymptomatic.  There is no chest pain tightness squeezing pressure burning chest still active exercise doing aerobic exercises as well as light weightlifting.  Recently he was diagnosed with severe decreased level of testosterone he was given some testosterone shots and he became much stronger and feels dramatically better.  I told him that testosterone could be detrimental for hypertrophic cardiomyopathy we decided to schedule another MRI in 6 months and if there is no change in the thickening of the wall of the heart will continue if not testosterone probably need to be discontinued  Past Medical History:  Diagnosis Date   Abnormal EKG 01/14/2018   Androgen deficiency    Chronic anticoagulation 10/24/2018   Dyslipidemia 01/05/2020   Elevated cholesterol    Hx of hypertrophic cardiomyopathy 09/15/2018   Minimal thickening of the apex with abnormal EKG based on the MRI   Paroxysmal atrial flutter ([place]) 01/05/2020   Tinnitus     Past Surgical History:  Procedure Laterality Date   CARDIOVERSION      Current Medications: Current Meds  Medication Sig   atorvastatin (LIPITOR) 10 MG tablet TAKE 1 TABLET BY MOUTH ONCE  DAILY (Patient taking differently: Take 10 mg by mouth daily.)   B Complex-C-Folic Acid (SUPER B COMPLEX/FA/VIT C) TABS Take 1 tablet by mouth daily. Unknown strength   fexofenadine (ALLEGRA) 180 MG tablet Take 180 mg by mouth as needed for allergies or rhinitis.   fluticasone (FLONASE) 50 MCG/ACT nasal spray USE 1 SPRAY IN EACH NOSTRIL ONCE A DAY (Patient taking differently: Place 1 spray into both nostrils daily.)   fluticasone (FLONASE) 50 MCG/ACT nasal spray Place 2 sprays into both nostrils daily.   Multiple Vitamin (MULTIVITAMIN) tablet Take 1 tablet by mouth daily. Unknown strenght   Psyllium (METAMUCIL FIBER PO) Take 6 capsules by mouth daily.   testosterone cypionate (DEPOTESTOTERONE CYPIONATE) 100 MG/ML injection Inject 1 mL (100 mg total) into the muscle every 14 (fourteen) days. (Patient taking differently: Inject 100 mg into the muscle every 14 (fourteen) days.)   Vitamin D, Cholecalciferol, 50 MCG (2000 UT) CAPS Take 2,000 Units by mouth daily.   XARELTO 20 MG TABS tablet TAKE 1 TABLET BY MOUTH ONCE A DAY   [DISCONTINUED] fluticasone (FLONASE) 50 MCG/ACT nasal spray Place 1 spray into the nose daily.     Allergies:   Patient has no known allergies.   Social History   Socioeconomic History   Marital status: Married    Spouse name: Not on file   Number of children: Not on file   Years of education: Not on file   Highest education level: Not on file  Occupational History   Not on file  Tobacco Use   Smoking status: Never   Smokeless tobacco: Never  Substance and Sexual Activity   Alcohol use: Yes    Alcohol/week: 2.0 standard drinks    Types: 2 Glasses of wine per week    Comment: 2 glasses a day    Drug use: Not on file   Sexual activity: Not on file  Other Topics Concern   Not on file  Social History Narrative   Not on file   Social Determinants of Health   Financial Resource Strain: Not on file  Food Insecurity: Not on file  Transportation Needs: Not on file   Physical Activity: Not on file  Stress: Not on file  Social Connections: Not on file     Family History: The patient's family history includes Atrial fibrillation in his mother; CAD in his father; Heart failure in his mother; Hyperlipidemia in his father; Idiopathic pulmonary fibrosis in his sister; Multiple myeloma in his father; Narcolepsy in his mother. ROS:   Please see the history of present illness.    All 14 point review of systems negative except as described per history of present illness  EKGs/Labs/Other Studies Reviewed:      Recent Labs: No results found for requested labs within last 8760 hours.  Recent Lipid Panel No results found for: CHOL, TRIG, HDL, CHOLHDL, VLDL, LDLCALC, LDLDIRECT  Physical Exam:    VS:  BP 124/68 (BP Location: Right Arm, Patient Position: Sitting)   Pulse 61   Ht '5\' 8"'  (1.727 m)   Wt 197 lb (89.4 kg)   SpO2 96%   BMI 29.95 kg/m     Wt Readings from Last 3 Encounters:  11/11/20 197 lb (89.4 kg)  03/18/20 192 lb (87.1 kg)  01/05/20 189 lb 9.6 oz (86 kg)     GEN:  Well nourished, well developed in no acute distress HEENT: Normal NECK: No JVD; No carotid bruits LYMPHATICS: No lymphadenopathy CARDIAC: RRR, no murmurs, no rubs, no gallops RESPIRATORY:  Clear to auscultation without rales, wheezing or rhonchi  ABDOMEN: Soft, non-tender, non-distended MUSCULOSKELETAL:  No edema; No deformity  SKIN: Warm and dry LOWER EXTREMITIES: no swelling NEUROLOGIC:  Alert and oriented x 3 PSYCHIATRIC:  Normal affect   ASSESSMENT:    1. Hypertrophic cardiomyopathy ([place])   2. Hx of hypertrophic cardiomyopathy   3. Chronic anticoagulation   4. Paroxysmal atrial flutter ([facility])   5. Dyslipidemia    PLAN:    In order of problems listed above:  Hypertrophic cardiomyopathy plan will be to repeat MRI in the winter of this year.  I will not change any of his medications overall he is looking good and he feels well.  Concern obviously is  testosterone that he used. Chronic anticoagulation will continue.  Again the reason for that is atrial fibrillation with CHADS2 vascular: 1 but charts score is not evaluated for somebody with hypertrophic cardiomyopathy Paroxysmal atrial flutter: Denies having a palpitations Dyslipidemia I will get his fasting lipid profile from primary care physician.   Medication Adjustments/Labs and Tests Ordered: Current medicines are reviewed at length with the patient today.  Concerns regarding medicines are outlined above.  Orders Placed This Encounter  Procedures   MR Card Morphology Wo/W Cm   EKG 12-Lead   Medication changes: No orders of the defined types were placed in this encounter.   Signed, Park Liter, MD, [facility] 11/11/2020 3:20 PM    [facility]

## 2020-11-11 NOTE — Patient Instructions (Addendum)
Medication Instructions:  Your physician recommends that you continue on your current medications as directed. Please refer to the Current Medication list given to you today.  *If you need a refill on your cardiac medications before your next appointment, please call your pharmacy*   Lab Work:  None If you have labs (blood work) drawn today and your tests are completely normal, you will receive your results only by: [place] (if you have MyChart) OR A paper copy in the mail If you have any lab test that is abnormal or we need to change your treatment, we will call you to review the results.   Testing/Procedures:  Your physician has requested that you have a cardiac MRI. Cardiac MRI uses a computer to create images of your heart as its beating, producing both still and moving pictures of your heart and major blood vessels. For further information please visit [email or web address] Please follow the instruction sheet given to you today for more information.    Follow-Up: At [facility], you and your health needs are our priority.  As part of our continuing mission to provide you with exceptional heart care, we have created designated Provider Care Teams.  These Care Teams include your primary Cardiologist (physician) and Advanced Practice Providers (APPs -  Physician Assistants and Nurse Practitioners) who all work together to provide you with the care you need, when you need it.  We recommend signing up for the patient portal called "MyChart".  Sign up information is provided on this After Visit Summary.  MyChart is used to connect with patients for Virtual Visits (Telemedicine).  Patients are able to view lab/test results, encounter notes, upcoming appointments, etc.  Non-urgent messages can be sent to your provider as well.   To learn more about what you can do with MyChart, go to [email or web address].    Your next appointment:   6 month(s)  The format for your next  appointment:   In Person  Provider:   Jenne Campus, MD   Other Instructions  Nuclear Medicine Exam A nuclear medicine exam is a safe and painless imaging test. It helps your health care provider detect and diagnose diseases. It also provides informationabout the ways your organs work and how they are structured. For a nuclear medicine exam, you will be given a radioactive material, called a tracer, that is absorbed by your body's organs. A large scanning machine detects the radioactive tracer and creates pictures of the areas that yourhealth care provider wants to know more about. There are several kinds of nuclear medicine exams. They include the following: CT scan. MRI scan. PET scan. SPECT scan. Tell your health care provider about: Any allergies you have. All medicines you are taking, including vitamins, herbs, eye drops, creams, and over-the-counter medicines. Any problems you or family members have had with anesthetic medicines. Any blood disorders you have. Any surgeries you have had. Any medical conditions you have. Whether you are pregnant, may be pregnant, or are breastfeeding. What are the risks? Generally, this is a safe procedure. However, problems may occur, such as: An allergic reaction to the tracer. This is rare. Exposure to radiation (a small amount). What happens before the procedure? Medicines Ask your health care provider about: Changing or stopping your regular medicines. This is especially important if you are taking diabetes medicines or blood thinners. Taking medicines such as aspirin and ibuprofen. These medicines can thin your blood. Do not take these medicines unless your health care provider tells you to take them.  Taking over-the-counter medicines, vitamins, herbs, and supplements. General instructions Follow instructions from your health care provider about eating and drinking restrictions. Do not wear jewelry. Wear loose, comfortable clothing.  You may be asked to wear a hospital gown for the procedure. Bring previous imaging studies, such as X-rays, with you to the exam if they are available. What happens during the procedure?  An IV may be inserted into one of your veins. You will be asked to lie on a table or sit in a chair. You will be given the radioactive tracer. You may get: A pill or liquid to swallow. An injection. Medicine through your IV. A gas to inhale. A large scanning machine will be used to create images of your body. After the pictures are taken, you may have to wait until your health care provider can make sure that enough images were taken. The procedure may vary among health care providers and hospitals. What can I expect after the procedure? It is up to you to get the results of your procedure. Ask your health care provider, or the department that is doing the procedure, when your results will be ready. Also ask: How will I get my results? What are my treatment options? What other tests do I need? What are my next steps? Follow these instructions at home: Drink enough water to keep your urine pale yellow (6-8 glasses). This helps to remove the radioactive tracer from your body. You may return to your normal activities as told by your health care provider. Get help right away if you: Have problems breathing. This symptom may represent a serious problem that is an emergency. Do not wait to see if the symptoms will go away. Get medical help right away. Call your local emergency services (911 in the [place] Do not drive yourself to the hospital. Summary A nuclear medicine exam is a safe and painless imaging test. It provides information about how your organs are working. It is also used to detect and diagnose diseases. During the procedure, you will be given a radioactive tracer. A large scanning machine will create images of your body. You may resume your normal activities after the procedure. Follow your health  care provider's instructions. Get help right away if you have problems breathing. This information is not intended to replace advice given to you by your health care provider. Make sure you discuss any questions you have with your healthcare provider. Document Revised: 09/12/2019 Document Reviewed: 09/12/2019 Elsevier Patient Education  2022 [employer].

## 2020-11-23 DIAGNOSIS — E291 Testicular hypofunction: Secondary | ICD-10-CM | POA: Diagnosis not present

## 2020-12-07 DIAGNOSIS — E291 Testicular hypofunction: Secondary | ICD-10-CM | POA: Diagnosis not present

## 2020-12-21 DIAGNOSIS — E291 Testicular hypofunction: Secondary | ICD-10-CM | POA: Diagnosis not present

## 2020-12-29 DIAGNOSIS — E782 Mixed hyperlipidemia: Secondary | ICD-10-CM | POA: Diagnosis not present

## 2020-12-29 DIAGNOSIS — E291 Testicular hypofunction: Secondary | ICD-10-CM | POA: Diagnosis not present

## 2020-12-29 DIAGNOSIS — Z79899 Other long term (current) drug therapy: Secondary | ICD-10-CM | POA: Diagnosis not present

## 2020-12-29 DIAGNOSIS — D7589 Other specified diseases of blood and blood-forming organs: Secondary | ICD-10-CM | POA: Diagnosis not present

## 2021-01-03 ENCOUNTER — Other Ambulatory Visit (HOSPITAL_COMMUNITY): Payer: Self-pay

## 2021-01-03 MED ORDER — FLUTICASONE PROPIONATE 50 MCG/ACT NA SUSP
NASAL | 3 refills | Status: DC
Start: 2021-01-03 — End: 2021-03-17
  Filled 2021-01-03: qty 16, 60d supply, fill #0
  Filled 2021-01-20: qty 16, 60d supply, fill #1

## 2021-01-04 DIAGNOSIS — E291 Testicular hypofunction: Secondary | ICD-10-CM | POA: Diagnosis not present

## 2021-01-06 DIAGNOSIS — Z79899 Other long term (current) drug therapy: Secondary | ICD-10-CM | POA: Diagnosis not present

## 2021-01-06 DIAGNOSIS — I483 Typical atrial flutter: Secondary | ICD-10-CM | POA: Diagnosis not present

## 2021-01-06 DIAGNOSIS — E291 Testicular hypofunction: Secondary | ICD-10-CM | POA: Diagnosis not present

## 2021-01-06 DIAGNOSIS — Z23 Encounter for immunization: Secondary | ICD-10-CM | POA: Diagnosis not present

## 2021-01-06 DIAGNOSIS — E782 Mixed hyperlipidemia: Secondary | ICD-10-CM | POA: Diagnosis not present

## 2021-01-06 DIAGNOSIS — Z7901 Long term (current) use of anticoagulants: Secondary | ICD-10-CM | POA: Diagnosis not present

## 2021-01-18 DIAGNOSIS — E291 Testicular hypofunction: Secondary | ICD-10-CM | POA: Diagnosis not present

## 2021-01-20 ENCOUNTER — Other Ambulatory Visit: Payer: Self-pay | Admitting: Cardiology

## 2021-01-20 ENCOUNTER — Other Ambulatory Visit (HOSPITAL_COMMUNITY): Payer: Self-pay

## 2021-01-20 MED ORDER — ATORVASTATIN CALCIUM 10 MG PO TABS
ORAL_TABLET | Freq: Every day | ORAL | 3 refills | Status: DC
  Filled 2021-01-20: qty 90, 90d supply, fill #0
  Filled 2021-04-25: qty 90, 90d supply, fill #1
  Filled 2021-07-25: qty 90, 90d supply, fill #2
  Filled 2021-10-24: qty 90, 90d supply, fill #3

## 2021-01-20 NOTE — Telephone Encounter (Signed)
Lipitor 10 mg # 90 x 3 refills sent to [facility]

## 2021-01-25 ENCOUNTER — Other Ambulatory Visit (HOSPITAL_COMMUNITY): Payer: Self-pay

## 2021-02-01 DIAGNOSIS — E291 Testicular hypofunction: Secondary | ICD-10-CM | POA: Diagnosis not present

## 2021-02-07 ENCOUNTER — Other Ambulatory Visit (HOSPITAL_COMMUNITY): Payer: Self-pay

## 2021-02-07 MED ORDER — TESTOSTERONE CYPIONATE 100 MG/ML IM SOLN
INTRAMUSCULAR | 0 refills | Status: DC
  Filled 2021-02-07 – 2021-02-17 (×2): qty 10, 90d supply, fill #0

## 2021-02-08 ENCOUNTER — Other Ambulatory Visit (HOSPITAL_COMMUNITY): Payer: Self-pay

## 2021-02-14 DIAGNOSIS — Z79899 Other long term (current) drug therapy: Secondary | ICD-10-CM | POA: Diagnosis not present

## 2021-02-14 DIAGNOSIS — Z23 Encounter for immunization: Secondary | ICD-10-CM | POA: Diagnosis not present

## 2021-02-14 DIAGNOSIS — R3129 Other microscopic hematuria: Secondary | ICD-10-CM | POA: Diagnosis not present

## 2021-02-14 DIAGNOSIS — E291 Testicular hypofunction: Secondary | ICD-10-CM | POA: Diagnosis not present

## 2021-02-15 DIAGNOSIS — E291 Testicular hypofunction: Secondary | ICD-10-CM | POA: Diagnosis not present

## 2021-02-17 ENCOUNTER — Other Ambulatory Visit (HOSPITAL_COMMUNITY): Payer: Self-pay

## 2021-02-20 ENCOUNTER — Other Ambulatory Visit (HOSPITAL_COMMUNITY): Payer: Self-pay

## 2021-02-20 DIAGNOSIS — Z0000 Encounter for general adult medical examination without abnormal findings: Secondary | ICD-10-CM | POA: Diagnosis not present

## 2021-02-20 DIAGNOSIS — E782 Mixed hyperlipidemia: Secondary | ICD-10-CM | POA: Diagnosis not present

## 2021-02-20 DIAGNOSIS — Z79899 Other long term (current) drug therapy: Secondary | ICD-10-CM | POA: Diagnosis not present

## 2021-02-20 DIAGNOSIS — I483 Typical atrial flutter: Secondary | ICD-10-CM | POA: Diagnosis not present

## 2021-02-20 DIAGNOSIS — E291 Testicular hypofunction: Secondary | ICD-10-CM | POA: Diagnosis not present

## 2021-02-20 MED ORDER — NITROGLYCERIN 0.4 MG SL SUBL
SUBLINGUAL_TABLET | SUBLINGUAL | 0 refills | Status: DC
  Filled 2021-02-20: qty 25, 10d supply, fill #0

## 2021-02-28 ENCOUNTER — Telehealth: Payer: Self-pay | Admitting: Emergency Medicine

## 2021-02-28 NOTE — Telephone Encounter (Signed)
Encounter to update medications.

## 2021-03-13 DIAGNOSIS — H524 Presbyopia: Secondary | ICD-10-CM | POA: Diagnosis not present

## 2021-03-16 DIAGNOSIS — E291 Testicular hypofunction: Secondary | ICD-10-CM | POA: Diagnosis not present

## 2021-03-17 ENCOUNTER — Other Ambulatory Visit: Payer: Self-pay

## 2021-03-17 ENCOUNTER — Other Ambulatory Visit (HOSPITAL_COMMUNITY): Payer: Self-pay

## 2021-03-17 ENCOUNTER — Ambulatory Visit: Payer: 59 | Admitting: Cardiology

## 2021-03-17 VITALS — BP 138/74 | HR 118 | Ht 68.0 in | Wt 193.2 lb

## 2021-03-17 DIAGNOSIS — E785 Hyperlipidemia, unspecified: Secondary | ICD-10-CM

## 2021-03-17 DIAGNOSIS — I4892 Unspecified atrial flutter: Secondary | ICD-10-CM

## 2021-03-17 DIAGNOSIS — Z8679 Personal history of other diseases of the circulatory system: Secondary | ICD-10-CM | POA: Diagnosis not present

## 2021-03-17 MED ORDER — DILTIAZEM HCL 60 MG PO TABS
60.0000 mg | ORAL_TABLET | Freq: Three times a day (TID) | ORAL | 1 refills | Status: DC
  Filled 2021-03-17: qty 270, 90d supply, fill #0

## 2021-03-17 NOTE — Progress Notes (Signed)
Cardiology Office Note:    Date:  03/17/2021   ID:  Evan Russell, DOB Jun 22, 1955, MRN [id number]  PCP:  [name], PA-C  Cardiologist:  Jenne Campus, MD    Referring MD: [name], PA-C   Chief Complaint  Patient presents with   HR elevated    Atrial Flutter    History of Present Illness:    Evan Russell is a 65 y.o. male with past medical history significant for apical variant of hypertrophic cardiomyopathy, there is no obstruction.  Initial diagnosis established in 2019 likely at that time MRI did not show any late gadolinium enhancement.  He also had history of paroxysmal atrial flutter.  He had only 1 documented episode but because of high risk of CVA with this arrhythmia in somebody with hypertrophic cardiomyopathy he is being anticoagulated with Xarelto.  He been doing great exercise on the regular basis no difficulty doing it.  He is not trying to push himself hard.  There is no palpitations there is no dizziness no passing out.  No swelling of lower extremities.  However recently he noted on his apple watch that his heart rate is elevated.  EKG has been performed and revealed typical atrial flutter with relatively controlled ventricular rate he comes here to talk about it.  Past Medical History:  Diagnosis Date   Abnormal EKG 01/14/2018   Androgen deficiency    Chronic anticoagulation 10/24/2018   Dyslipidemia 01/05/2020   Elevated cholesterol    Hx of hypertrophic cardiomyopathy 09/15/2018   Minimal thickening of the apex with abnormal EKG based on the MRI   Paroxysmal atrial flutter ([place]) 01/05/2020   Tinnitus     Past Surgical History:  Procedure Laterality Date   CARDIOVERSION      Current Medications: Current Meds  Medication Sig   atorvastatin (LIPITOR) 10 MG tablet TAKE 1 TABLET BY MOUTH ONCE DAILY (Patient taking differently: Take 10 mg by mouth daily.)   B Complex-C-Folic Acid (SUPER B COMPLEX/FA/VIT C) TABS Take 1 tablet by mouth daily. Unknown  strength   diltiazem (CARDIZEM) 60 MG tablet Take 60 mg by mouth 3 (three) times daily.   fexofenadine (ALLEGRA) 180 MG tablet Take 180 mg by mouth as needed for allergies or rhinitis.   fluticasone (FLONASE) 50 MCG/ACT nasal spray USE 1 SPRAY IN EACH NOSTRIL ONCE A DAY (Patient taking differently: Place 1 spray into both nostrils daily.)   Multiple Vitamin (MULTIVITAMIN) tablet Take 1 tablet by mouth daily. Unknown strenght   nitroGLYCERIN (NITROSTAT) 0.4 MG SL tablet Dissolve 1 tablet (0.4 mg total) under the tongue every 5 (five) minutes as needed for chest pain (Patient taking differently: Place 0.4 mg under the tongue every 5 (five) minutes as needed for chest pain.)   Psyllium (METAMUCIL FIBER PO) Take 6 capsules by mouth daily. Unknown strength   testosterone cypionate (DEPOTESTOTERONE CYPIONATE) 100 MG/ML injection Inject 1 mL (100 mg total) into the muscle every 14 (fourteen) days. (Patient taking differently: Inject 100 mg into the muscle every 14 (fourteen) days.)   Vitamin D, Cholecalciferol, 50 MCG (2000 UT) CAPS Take 2,000 Units by mouth daily.   XARELTO 20 MG TABS tablet TAKE 1 TABLET BY MOUTH ONCE A DAY     Allergies:   Patient has no known allergies.   Social History   Socioeconomic History   Marital status: Married    Spouse name: Not on file   Number of children: Not on file   Years of education:  Not on file   Highest education level: Not on file  Occupational History   Not on file  Tobacco Use   Smoking status: Never   Smokeless tobacco: Never  Substance and Sexual Activity   Alcohol use: Yes    Alcohol/week: 2.0 standard drinks    Types: 2 Glasses of wine per week    Comment: 2 glasses a day    Drug use: Not on file   Sexual activity: Not on file  Other Topics Concern   Not on file  Social History Narrative   Not on file   Social Determinants of Health   Financial Resource Strain: Not on file  Food Insecurity: Not on file  Transportation Needs: Not on  file  Physical Activity: Not on file  Stress: Not on file  Social Connections: Not on file     Family History: The patient's family history includes Atrial fibrillation in his mother; CAD in his father; Heart failure in his mother; Hyperlipidemia in his father; Idiopathic pulmonary fibrosis in his sister; Multiple myeloma in his father; Narcolepsy in his mother. ROS:   Please see the history of present illness.    All 14 point review of systems negative except as described per history of present illness  EKGs/Labs/Other Studies Reviewed:      Recent Labs: No results found for requested labs within last 8760 hours.  Recent Lipid Panel No results found for: CHOL, TRIG, HDL, CHOLHDL, VLDL, LDLCALC, LDLDIRECT  Physical Exam:    VS:  BP 138/74 (BP Location: Left Arm, Patient Position: Sitting)   Pulse (!) 118   Ht 5' 8" (1.727 m)   Wt 193 lb 3.2 oz (87.6 kg)   SpO2 98%   BMI 29.38 kg/m     Wt Readings from Last 3 Encounters:  03/17/21 193 lb 3.2 oz (87.6 kg)  11/11/20 197 lb (89.4 kg)  03/18/20 192 lb (87.1 kg)     GEN:  Well nourished, well developed in no acute distress HEENT: Normal NECK: No JVD; No carotid bruits LYMPHATICS: No lymphadenopathy CARDIAC: RRR, no murmurs, no rubs, no gallops RESPIRATORY:  Clear to auscultation without rales, wheezing or rhonchi  ABDOMEN: Soft, non-tender, non-distended MUSCULOSKELETAL:  No edema; No deformity  SKIN: Warm and dry LOWER EXTREMITIES: no swelling NEUROLOGIC:  Alert and oriented x 3 PSYCHIATRIC:  Normal affect   ASSESSMENT:    1. Paroxysmal atrial flutter ([place])   2. Hx of hypertrophic cardiomyopathy   3. Dyslipidemia    PLAN:    In order of problems listed above:  Atrial flutter which is completely asymptomatic.  He is anticoagulant which I will continue.  His AV blockade is achieved with Cardizem 60 mg 3 times daily.  We did talk about options for the situation.  He want to be cardioverted like previously in 2019.   Therefore, arrangements will be made for elective electrical cardioversion of atrial flutter to sinus rhythm.  Since this is second documented episode of atrial flutter I will schedule him to have the EP team evaluated him for possible atrial flutter ablation. Hypertrophic cardiomyopathy.  Likely he does not have any symptoms of it.  He is scheduled to have repeated MRI to reevaluate late gadolinium enhancement. Dyslipidemia, he is on Lipitor 10 which I will continue I did review K PN however last numbers I have is from 2017 with LDL 97 HDL 79.  We will recheck cholesterol.   Medication Adjustments/Labs and Tests Ordered: Current medicines are reviewed at length with  the patient today.  Concerns regarding medicines are outlined above.  No orders of the defined types were placed in this encounter.  Medication changes: No orders of the defined types were placed in this encounter.   Signed, Park Liter, MD, [facility] 03/17/2021 2:11 PM    [place]

## 2021-03-17 NOTE — H&P (View-Only) (Signed)
Cardiology Office Note:    Date:  03/17/2021   ID:  Evan Russell, DOB 08-25-1955, MRN [id number]  PCP:  [name], PA-C  Cardiologist:  Jenne Campus, MD    Referring MD: [name], PA-C   Chief Complaint  Patient presents with   HR elevated    Atrial Flutter    History of Present Illness:    Evan Russell is a 65 y.o. male with past medical history significant for apical variant of hypertrophic cardiomyopathy, there is no obstruction.  Initial diagnosis established in 2019 likely at that time MRI did not show any late gadolinium enhancement.  He also had history of paroxysmal atrial flutter.  He had only 1 documented episode but because of high risk of CVA with this arrhythmia in somebody with hypertrophic cardiomyopathy he is being anticoagulated with Xarelto.  He been doing great exercise on the regular basis no difficulty doing it.  He is not trying to push himself hard.  There is no palpitations there is no dizziness no passing out.  No swelling of lower extremities.  However recently he noted on his apple watch that his heart rate is elevated.  EKG has been performed and revealed typical atrial flutter with relatively controlled ventricular rate he comes here to talk about it.  Past Medical History:  Diagnosis Date   Abnormal EKG 01/14/2018   Androgen deficiency    Chronic anticoagulation 10/24/2018   Dyslipidemia 01/05/2020   Elevated cholesterol    Hx of hypertrophic cardiomyopathy 09/15/2018   Minimal thickening of the apex with abnormal EKG based on the MRI   Paroxysmal atrial flutter ([place]) 01/05/2020   Tinnitus     Past Surgical History:  Procedure Laterality Date   CARDIOVERSION      Current Medications: Current Meds  Medication Sig   atorvastatin (LIPITOR) 10 MG tablet TAKE 1 TABLET BY MOUTH ONCE DAILY (Patient taking differently: Take 10 mg by mouth daily.)   B Complex-C-Folic Acid (SUPER B COMPLEX/FA/VIT C) TABS Take 1 tablet by mouth daily. Unknown  strength   diltiazem (CARDIZEM) 60 MG tablet Take 60 mg by mouth 3 (three) times daily.   fexofenadine (ALLEGRA) 180 MG tablet Take 180 mg by mouth as needed for allergies or rhinitis.   fluticasone (FLONASE) 50 MCG/ACT nasal spray USE 1 SPRAY IN EACH NOSTRIL ONCE A DAY (Patient taking differently: Place 1 spray into both nostrils daily.)   Multiple Vitamin (MULTIVITAMIN) tablet Take 1 tablet by mouth daily. Unknown strenght   nitroGLYCERIN (NITROSTAT) 0.4 MG SL tablet Dissolve 1 tablet (0.4 mg total) under the tongue every 5 (five) minutes as needed for chest pain (Patient taking differently: Place 0.4 mg under the tongue every 5 (five) minutes as needed for chest pain.)   Psyllium (METAMUCIL FIBER PO) Take 6 capsules by mouth daily. Unknown strength   testosterone cypionate (DEPOTESTOTERONE CYPIONATE) 100 MG/ML injection Inject 1 mL (100 mg total) into the muscle every 14 (fourteen) days. (Patient taking differently: Inject 100 mg into the muscle every 14 (fourteen) days.)   Vitamin D, Cholecalciferol, 50 MCG (2000 UT) CAPS Take 2,000 Units by mouth daily.   XARELTO 20 MG TABS tablet TAKE 1 TABLET BY MOUTH ONCE A DAY     Allergies:   Patient has no known allergies.   Social History   Socioeconomic History   Marital status: Married    Spouse name: Not on file   Number of children: Not on file   Years of education:  Not on file   Highest education level: Not on file  Occupational History   Not on file  Tobacco Use   Smoking status: Never   Smokeless tobacco: Never  Substance and Sexual Activity   Alcohol use: Yes    Alcohol/week: 2.0 standard drinks    Types: 2 Glasses of wine per week    Comment: 2 glasses a day    Drug use: Not on file   Sexual activity: Not on file  Other Topics Concern   Not on file  Social History Narrative   Not on file   Social Determinants of Health   Financial Resource Strain: Not on file  Food Insecurity: Not on file  Transportation Needs: Not on  file  Physical Activity: Not on file  Stress: Not on file  Social Connections: Not on file     Family History: The patient's family history includes Atrial fibrillation in his mother; CAD in his father; Heart failure in his mother; Hyperlipidemia in his father; Idiopathic pulmonary fibrosis in his sister; Multiple myeloma in his father; Narcolepsy in his mother. ROS:   Please see the history of present illness.    All 14 point review of systems negative except as described per history of present illness  EKGs/Labs/Other Studies Reviewed:      Recent Labs: No results found for requested labs within last 8760 hours.  Recent Lipid Panel No results found for: CHOL, TRIG, HDL, CHOLHDL, VLDL, LDLCALC, LDLDIRECT  Physical Exam:    VS:  BP 138/74 (BP Location: Left Arm, Patient Position: Sitting)   Pulse (!) 118   Ht 5' 8" (1.727 m)   Wt 193 lb 3.2 oz (87.6 kg)   SpO2 98%   BMI 29.38 kg/m     Wt Readings from Last 3 Encounters:  03/17/21 193 lb 3.2 oz (87.6 kg)  11/11/20 197 lb (89.4 kg)  03/18/20 192 lb (87.1 kg)     GEN:  Well nourished, well developed in no acute distress HEENT: Normal NECK: No JVD; No carotid bruits LYMPHATICS: No lymphadenopathy CARDIAC: RRR, no murmurs, no rubs, no gallops RESPIRATORY:  Clear to auscultation without rales, wheezing or rhonchi  ABDOMEN: Soft, non-tender, non-distended MUSCULOSKELETAL:  No edema; No deformity  SKIN: Warm and dry LOWER EXTREMITIES: no swelling NEUROLOGIC:  Alert and oriented x 3 PSYCHIATRIC:  Normal affect   ASSESSMENT:    1. Paroxysmal atrial flutter ([facility])   2. Hx of hypertrophic cardiomyopathy   3. Dyslipidemia    PLAN:    In order of problems listed above:  Atrial flutter which is completely asymptomatic.  He is anticoagulant which I will continue.  His AV blockade is achieved with Cardizem 60 mg 3 times daily.  We did talk about options for the situation.  He want to be cardioverted like previously in 2019.   Therefore, arrangements will be made for elective electrical cardioversion of atrial flutter to sinus rhythm.  Since this is second documented episode of atrial flutter I will schedule him to have the EP team evaluated him for possible atrial flutter ablation. Hypertrophic cardiomyopathy.  Likely he does not have any symptoms of it.  He is scheduled to have repeated MRI to reevaluate late gadolinium enhancement. Dyslipidemia, he is on Lipitor 10 which I will continue I did review K PN however last numbers I have is from 2017 with LDL 97 HDL 79.  We will recheck cholesterol.   Medication Adjustments/Labs and Tests Ordered: Current medicines are reviewed at length with   the patient today.  Concerns regarding medicines are outlined above.  No orders of the defined types were placed in this encounter.  Medication changes: No orders of the defined types were placed in this encounter.   Signed, Lindbergh Winkles J. Mckynna Vanloan, MD, FACC 03/17/2021 2:11 PM    [facility]  

## 2021-03-17 NOTE — Patient Instructions (Signed)
Medication Instructions:  Your physician recommends that you continue on your current medications as directed. Please refer to the Current Medication list given to you today.  *If you need a refill on your cardiac medications before your next appointment, please call your pharmacy*   Lab Work: Your physician recommends that you return for lab work today: bmp. Cbc  If you have labs (blood work) drawn today and your tests are completely normal, you will receive your results only by: [place] (if you have MyChart) OR A paper copy in the mail If you have any lab test that is abnormal or we need to change your treatment, we will call you to review the results.   Testing/Procedures:  You are scheduled for a Cardioversion on 03/24/21 with Dr. Oval Linsey.  Please arrive at the [facility] (Main Entrance A) at [facility]: [address] [place], [place] [address] at 9:00 am   DIET: Nothing to eat or drink after midnight except a sip of water with medications (see medication instructions below)  FYI: For your safety, and to allow [place] to monitor your vital signs accurately during the surgery/procedure we request that   if you have artificial nails, gel coating, SNS etc. Please have those removed prior to your surgery/procedure. Not having the nail coverings /polish removed may result in cancellation or delay of your surgery/procedure.   Medication Instructions: Continue your anticoagulant: xarelto  You will need to continue your anticoagulant after your procedure until you  are told by your  Provider that it is safe to stop   Labs: today     You must have a responsible person to drive you home and stay in the waiting area during your procedure. Failure to do so could result in cancellation.  Bring your insurance cards.  *Special Note: Every effort is made to have your procedure done on time. Occasionally there are emergencies that occur at the hospital that may cause delays.  Please be patient if a delay does occur.     Follow-Up: At [facility], you and your health needs are our priority.  As part of our continuing mission to provide you with exceptional heart care, we have created designated Provider Care Teams.  These Care Teams include your primary Cardiologist (physician) and Advanced Practice Providers (APPs -  Physician Assistants and Nurse Practitioners) who all work together to provide you with the care you need, when you need it.  We recommend signing up for the patient portal called "MyChart".  Sign up information is provided on this After Visit Summary.  MyChart is used to connect with patients for Virtual Visits (Telemedicine).  Patients are able to view lab/test results, encounter notes, upcoming appointments, etc.  Non-urgent messages can be sent to your provider as well.   To learn more about what you can do with MyChart, go to [email or web address].    Your next appointment:   3 month(s)  The format for your next appointment:   In Person  Provider:   Jenne Campus, MD   Other Instructions  Electrical Cardioversion Electrical cardioversion is the delivery of a jolt of electricity to restore a normal rhythm to the heart. A rhythm that is too fast or is not regular keeps the heart from pumping well. In this procedure, sticky patches or metal paddles are placed on the chest to deliver electricity to the heart from a device. This procedure may be done in an emergency if: There is low or no blood pressure as  a result of the heart rhythm. Normal rhythm must be restored as fast as possible to protect the brain and heart from further damage. It may save a life. This may also be a scheduled procedure for irregular or fast heart rhythms that are not immediately life-threatening. Tell a health care provider about: Any allergies you have. All medicines you are taking, including vitamins, herbs, eye drops, creams, and over-the-counter  medicines. Any problems you or family members have had with anesthetic medicines. Any blood disorders you have. Any surgeries you have had. Any medical conditions you have. Whether you are pregnant or may be pregnant. What are the risks? Generally, this is a safe procedure. However, problems may occur, including: Allergic reactions to medicines. A blood clot that breaks free and travels to other parts of your body. The possible return of an abnormal heart rhythm within hours or days after the procedure. Your heart stopping (cardiac arrest). This is rare. What happens before the procedure? Medicines Your health care provider may have you start taking: Blood-thinning medicines (anticoagulants) so your blood does not clot as easily. Medicines to help stabilize your heart rate and rhythm. Ask your health care provider about: Changing or stopping your regular medicines. This is especially important if you are taking diabetes medicines or blood thinners. Taking medicines such as aspirin and ibuprofen. These medicines can thin your blood. Do not take these medicines unless your health care provider tells you to take them. Taking over-the-counter medicines, vitamins, herbs, and supplements. General instructions Follow instructions from your health care provider about eating or drinking restrictions. Plan to have someone take you home from the hospital or clinic. If you will be going home right after the procedure, plan to have someone with you for 24 hours. Ask your health care provider what steps will be taken to help prevent infection. These may include washing your skin with a germ-killing soap. What happens during the procedure?  An IV will be inserted into one of your veins. Sticky patches (electrodes) or metal paddles may be placed on your chest. You will be given a medicine to help you relax (sedative). An electrical shock will be delivered. The procedure may vary among health care  providers and hospitals. What can I expect after the procedure? Your blood pressure, heart rate, breathing rate, and blood oxygen level will be monitored until you leave the hospital or clinic. Your heart rhythm will be watched to make sure it does not change. You may have some redness on the skin where the shocks were given. Follow these instructions at home: Do not drive for 24 hours if you were given a sedative during your procedure. Take over-the-counter and prescription medicines only as told by your health care provider. Ask your health care provider how to check your pulse. Check it often. Rest for 48 hours after the procedure or as told by your health care provider. Avoid or limit your caffeine use as told by your health care provider. Keep all follow-up visits as told by your health care provider. This is important. Contact a health care provider if: You feel like your heart is beating too quickly or your pulse is not regular. You have a serious muscle cramp that does not go away. Get help right away if: You have discomfort in your chest. You are dizzy or you feel faint. You have trouble breathing or you are short of breath. Your speech is slurred. You have trouble moving an arm or leg on one side of  your body. Your fingers or toes turn cold or blue. Summary Electrical cardioversion is the delivery of a jolt of electricity to restore a normal rhythm to the heart. This procedure may be done right away in an emergency or may be a scheduled procedure if the condition is not an emergency. Generally, this is a safe procedure. After the procedure, check your pulse often as told by your health care provider. This information is not intended to replace advice given to you by your health care provider. Make sure you discuss any questions you have with your health care provider. Document Revised: 12/08/2018 Document Reviewed: 12/08/2018 Elsevier Patient Education  [place].

## 2021-03-18 LAB — CBC
Hematocrit: 48.4 % (ref 37.5–51.0)
Hemoglobin: 17.4 g/dL (ref 13.0–17.7)
MCH: 35.8 pg — ABNORMAL HIGH (ref 26.6–33.0)
MCHC: 36 g/dL — ABNORMAL HIGH (ref 31.5–35.7)
MCV: 100 fL — ABNORMAL HIGH (ref 79–97)
Platelets: 155 10*3/uL (ref 150–450)
RBC: 4.86 x10E6/uL (ref 4.14–5.80)
RDW: 13.2 % (ref 11.6–15.4)
WBC: 4.6 10*3/uL (ref 3.4–10.8)

## 2021-03-18 LAB — BASIC METABOLIC PANEL
BUN/Creatinine Ratio: 20 (ref 10–24)
BUN: 22 mg/dL (ref 8–27)
CO2: 19 mmol/L — ABNORMAL LOW (ref 20–29)
Calcium: 9.7 mg/dL (ref 8.6–10.2)
Chloride: 100 mmol/L (ref 96–106)
Creatinine, Ser: 1.12 mg/dL (ref 0.76–1.27)
Glucose: 227 mg/dL — ABNORMAL HIGH (ref 70–99)
Potassium: 3.8 mmol/L (ref 3.5–5.2)
Sodium: 136 mmol/L (ref 134–144)
eGFR: 72 mL/min/{1.73_m2} (ref >59)

## 2021-03-24 ENCOUNTER — Encounter (HOSPITAL_COMMUNITY)
Admission: RE | Disposition: A | Discharge status: Home / Self Care | Payer: Self-pay | Source: Home / Self Care | Attending: Cardiovascular Disease

## 2021-03-24 ENCOUNTER — Ambulatory Visit (HOSPITAL_COMMUNITY): Payer: 59 | Admitting: Anesthesiology

## 2021-03-24 ENCOUNTER — Encounter (HOSPITAL_COMMUNITY): Payer: Self-pay | Admitting: Cardiovascular Disease

## 2021-03-24 ENCOUNTER — Ambulatory Visit (HOSPITAL_COMMUNITY)
Admission: RE | Admit: 2021-03-24 | Discharge: 2021-03-24 | Disposition: A | Discharge status: Home / Self Care | Payer: 59 | Attending: Cardiovascular Disease | Admitting: Cardiovascular Disease

## 2021-03-24 DIAGNOSIS — I483 Typical atrial flutter: Secondary | ICD-10-CM | POA: Diagnosis not present

## 2021-03-24 DIAGNOSIS — I422 Other hypertrophic cardiomyopathy: Secondary | ICD-10-CM | POA: Diagnosis not present

## 2021-03-24 DIAGNOSIS — I4891 Unspecified atrial fibrillation: Secondary | ICD-10-CM | POA: Insufficient documentation

## 2021-03-24 DIAGNOSIS — Z7901 Long term (current) use of anticoagulants: Secondary | ICD-10-CM | POA: Diagnosis not present

## 2021-03-24 DIAGNOSIS — H9319 Tinnitus, unspecified ear: Secondary | ICD-10-CM | POA: Diagnosis not present

## 2021-03-24 DIAGNOSIS — E291 Testicular hypofunction: Secondary | ICD-10-CM | POA: Diagnosis not present

## 2021-03-24 DIAGNOSIS — I4819 Other persistent atrial fibrillation: Secondary | ICD-10-CM

## 2021-03-24 DIAGNOSIS — E7800 Pure hypercholesterolemia, unspecified: Secondary | ICD-10-CM | POA: Diagnosis not present

## 2021-03-24 HISTORY — PX: CARDIOVERSION: SHX1299

## 2021-03-24 SURGERY — CARDIOVERSION
Anesthesia: General

## 2021-03-24 MED ORDER — LIDOCAINE 2% (20 MG/ML) 5 ML SYRINGE
INTRAMUSCULAR | Status: DC | PRN
  Administered 2021-03-24: 60 mg via INTRAVENOUS

## 2021-03-24 MED ORDER — SODIUM CHLORIDE 0.9 % IV SOLN: INTRAVENOUS | Status: DC

## 2021-03-24 MED ORDER — PROPOFOL 10 MG/ML IV BOLUS
INTRAVENOUS | Status: DC | PRN
  Administered 2021-03-24: 70 mg via INTRAVENOUS

## 2021-03-24 NOTE — Anesthesia Procedure Notes (Signed)
Procedure Name: General with mask airway Date/Time: 03/24/2021 10:08 AM Performed by: Erick Colace, CRNA Pre-anesthesia Checklist: Patient identified, Emergency Drugs available, Patient being monitored, Suction available and Timeout performed Oxygen Delivery Method: Ambu bag Preoxygenation: Pre-oxygenation with 100% oxygen Induction Type: IV induction

## 2021-03-24 NOTE — Transfer of Care (Signed)
Immediate Anesthesia Transfer of Care Note  Patient: Evan Russell  Procedure(s) Performed: CARDIOVERSION  Patient Location: Short Stay  Anesthesia Type:General  Level of Consciousness: drowsy  Airway & Oxygen Therapy: Patient Spontanous Breathing  Post-op Assessment: Report given to RN and Post -op Vital signs reviewed and stable  Post vital signs: Reviewed and stable  Last Vitals:  Vitals Value Taken Time  BP    Temp    Pulse    Resp    SpO2      Last Pain:  Vitals:   03/24/21 0915  TempSrc: Temporal  PainSc: 0-No pain         Complications: No notable events documented.

## 2021-03-24 NOTE — Anesthesia Preprocedure Evaluation (Addendum)
Anesthesia Evaluation  Patient identified by MRN, date of birth, ID band Patient awake    Reviewed: Allergy & Precautions, NPO status , Patient's Chart, lab work & pertinent test results  Airway Mallampati: III  TM Distance: >3 FB Neck ROM: Full    Dental no notable dental hx. (+) Teeth Intact, Dental Advisory Given   Pulmonary neg pulmonary ROS,    Pulmonary exam normal breath sounds clear to auscultation       Cardiovascular Normal cardiovascular exam+ dysrhythmias (on xarelto) Atrial Fibrillation  Rhythm:Irregular Rate:Normal  TTE 2021 1. Left ventricular ejection fraction, by estimation, is 60 to 65%. The  left ventricle has normal function. The left ventricle has no regional  wall motion abnormalities. There is mild asymmetric left ventricular  hypertrophy of the posterior segment.  Left ventricular diastolic parameters are consistent with Grade II  diastolic dysfunction (pseudonormalization).  2. Right ventricular systolic function is normal. The right ventricular  size is normal.  3. The mitral valve is normal in structure. No evidence of mitral valve  regurgitation. No evidence of mitral stenosis.  4. The aortic valve is normal in structure. Aortic valve regurgitation is  not visualized. No aortic stenosis is present.  5. The inferior vena cava is normal in size with greater than 50%  respiratory variability, suggesting right atrial pressure of 3 mmHg   Neuro/Psych negative neurological ROS  negative psych ROS   GI/Hepatic negative GI ROS, Neg liver ROS,   Endo/Other  negative endocrine ROS  Renal/GU negative Renal ROS  negative genitourinary   Musculoskeletal negative musculoskeletal ROS (+)   Abdominal   Peds  Hematology negative hematology ROS (+)   Anesthesia Other Findings   Reproductive/Obstetrics                           Anesthesia Physical Anesthesia Plan  ASA:  3  Anesthesia Plan: General   Post-op Pain Management:    Induction: Intravenous  PONV Risk Score and Plan: 2 and Propofol infusion and Treatment may vary due to age or medical condition  Airway Management Planned: Natural Airway  Additional Equipment:   Intra-op Plan:   Post-operative Plan:   Informed Consent: I have reviewed the patients History and Physical, chart, labs and discussed the procedure including the risks, benefits and alternatives for the proposed anesthesia with the patient or authorized representative who has indicated his/her understanding and acceptance.     Dental advisory given  Plan Discussed with: CRNA  Anesthesia Plan Comments:         Anesthesia Quick Evaluation

## 2021-03-24 NOTE — Interval H&P Note (Signed)
History and Physical Interval Note:  03/24/2021 9:55 AM  Babs Sciara  has presented today for surgery, with the diagnosis of AFIB.  The various methods of treatment have been discussed with the patient and family. After consideration of risks, benefits and other options for treatment, the patient has consented to  Procedure(s): CARDIOVERSION (N/A) as a surgical intervention.  The patient's history has been reviewed, patient examined, no change in status, stable for surgery.  I have reviewed the patient's chart and labs.  Questions were answered to the patient's satisfaction.     Skeet Latch, MD

## 2021-03-24 NOTE — Discharge Instructions (Signed)

## 2021-03-24 NOTE — Anesthesia Postprocedure Evaluation (Signed)
Anesthesia Post Note  Patient: Evan Russell  Procedure(s) Performed: CARDIOVERSION     Patient location during evaluation: Endoscopy Anesthesia Type: General Level of consciousness: awake and alert Pain management: pain level controlled Vital Signs Assessment: post-procedure vital signs reviewed and stable Respiratory status: spontaneous breathing, nonlabored ventilation, respiratory function stable and patient connected to nasal cannula oxygen Cardiovascular status: blood pressure returned to baseline and stable Postop Assessment: no apparent nausea or vomiting Anesthetic complications: no   No notable events documented.  Last Vitals:  Vitals:   03/24/21 1025 03/24/21 1035  BP: 100/66 107/68  Pulse: (!) 59 62  Resp: 16 17  Temp:    SpO2: 95% 95%    Last Pain:  Vitals:   03/24/21 1035  TempSrc:   PainSc: 0-No pain                 Evan Russell

## 2021-03-24 NOTE — CV Procedure (Signed)
Electrical Cardioversion Procedure Note MUBASHIR MALLEK [id number] [birth year]  Procedure: Electrical Cardioversion Indications:  Atrial Fibrillation  Procedure Details Consent: Risks of procedure as well as the alternatives and risks of each were explained to the (patient/caregiver).  Consent for procedure obtained. Time Out: Verified patient identification, verified procedure, site/side was marked, verified correct patient position, special equipment/implants available, medications/allergies/relevent history reviewed, required imaging and test results available.  Performed  Patient placed on cardiac monitor, pulse oximetry, supplemental oxygen as necessary.  Sedation given:  propofol Pacer pads placed anterior and posterior chest.  Cardioverted 1 time(s).  Cardioverted at 150J.  Evaluation Findings: Post procedure EKG shows:  sinus rhythm with PACs Complications: None Patient did tolerate procedure well.   Skeet Latch, MD 03/24/2021, 10:07 AM

## 2021-03-26 ENCOUNTER — Encounter (HOSPITAL_COMMUNITY): Payer: Self-pay | Admitting: Cardiovascular Disease

## 2021-04-03 DIAGNOSIS — E291 Testicular hypofunction: Secondary | ICD-10-CM | POA: Diagnosis not present

## 2021-04-06 ENCOUNTER — Telehealth (HOSPITAL_COMMUNITY): Payer: Self-pay | Admitting: *Deleted

## 2021-04-06 NOTE — Telephone Encounter (Signed)
Attempted to call patient regarding upcoming cardiac MRI appointment. Left message on voicemail with name and callback number  Shirl Weir RN Navigator Cardiac Imaging [facility] [phone] Office [phone] Cell  

## 2021-04-06 NOTE — Telephone Encounter (Signed)
Patient returning call regarding upcoming cardiac imaging study; pt verbalizes understanding of appt date/time, parking situation and where to check in, and verified current allergies; name and call back number provided for further questions should they arise  Diannie Willner RN Navigator Cardiac Imaging [facility] [phone] office [phone] cell  

## 2021-04-07 ENCOUNTER — Ambulatory Visit (HOSPITAL_COMMUNITY)
Admission: RE | Admit: 2021-04-07 | Discharge: 2021-04-07 | Disposition: A | Discharge status: Home / Self Care | Payer: 59 | Source: Ambulatory Visit | Attending: Cardiology | Admitting: Cardiology

## 2021-04-07 ENCOUNTER — Other Ambulatory Visit: Payer: Self-pay

## 2021-04-07 DIAGNOSIS — I422 Other hypertrophic cardiomyopathy: Secondary | ICD-10-CM | POA: Diagnosis not present

## 2021-04-07 MED ORDER — GADOBUTROL 1 MMOL/ML IV SOLN
8.0000 mL | Freq: Once | INTRAVENOUS | Status: AC | PRN
  Administered 2021-04-07: 8 mL via INTRAVENOUS

## 2021-04-12 ENCOUNTER — Other Ambulatory Visit: Payer: Self-pay

## 2021-04-12 ENCOUNTER — Encounter: Payer: Self-pay | Admitting: Cardiology

## 2021-04-12 ENCOUNTER — Ambulatory Visit: Payer: 59 | Admitting: Cardiology

## 2021-04-12 VITALS — BP 120/76 | HR 76 | Ht 68.0 in | Wt 192.2 lb

## 2021-04-12 DIAGNOSIS — I483 Typical atrial flutter: Secondary | ICD-10-CM

## 2021-04-12 DIAGNOSIS — Z01812 Encounter for preprocedural laboratory examination: Secondary | ICD-10-CM | POA: Diagnosis not present

## 2021-04-12 DIAGNOSIS — Z01818 Encounter for other preprocedural examination: Secondary | ICD-10-CM

## 2021-04-12 LAB — CBC
Hematocrit: 47.5 % (ref 37.5–51.0)
Hemoglobin: 16.7 g/dL (ref 13.0–17.7)
MCH: 35.5 pg — ABNORMAL HIGH (ref 26.6–33.0)
MCHC: 35.2 g/dL (ref 31.5–35.7)
MCV: 101 fL — ABNORMAL HIGH (ref 79–97)
Platelets: 191 10*3/uL (ref 150–450)
RBC: 4.71 x10E6/uL (ref 4.14–5.80)
RDW: 12.9 % (ref 11.6–15.4)
WBC: 6 10*3/uL (ref 3.4–10.8)

## 2021-04-12 LAB — BASIC METABOLIC PANEL
BUN/Creatinine Ratio: 19 (ref 10–24)
BUN: 20 mg/dL (ref 8–27)
CO2: 23 mmol/L (ref 20–29)
Calcium: 9.8 mg/dL (ref 8.6–10.2)
Chloride: 102 mmol/L (ref 96–106)
Creatinine, Ser: 1.05 mg/dL (ref 0.76–1.27)
Glucose: 106 mg/dL — ABNORMAL HIGH (ref 70–99)
Potassium: 4.1 mmol/L (ref 3.5–5.2)
Sodium: 139 mmol/L (ref 134–144)
eGFR: 78 mL/min/{1.73_m2} (ref >59)

## 2021-04-12 NOTE — H&P (View-Only) (Signed)
Electrophysiology Office Note   Date:  04/12/2021   ID:  Evan Russell, DOB 1955-05-30, MRN [id number]  PCP:  [name], PA-C  Cardiologist:  Agustin Cree Primary Electrophysiologist:  Kashlynn Kundert Meredith Leeds, MD    Chief Complaint: AF   History of Present Illness: Evan Russell is a 65 y.o. male who is being seen today for the evaluation of AF at the request of Park Liter, MD. Presenting today for electrophysiology evaluation.  He has a history significant for apical variant hypertrophic cardiomyopathy initially diagnosed in 2019.  MRI did not show any late gadolinium enhancement.  He also has a history of atrial flutter.  He also has a history of CVA and is on Xarelto.  He presented to his primary cardiologist office and was found to be in typical atrial flutter.  He had a cardioversion performed in the early part of this year back to normal rhythm.  He was traveling in [place] for approximately 2 weeks.  He noted that he felt a little bit fatigued.  He checked his watch which showed him that he was out of rhythm with rapid heart rates.  He was told to take diltiazem.  He had a second cardioversion after that.  He had done well up until approximately a week ago when he noted that his baseline heart rate was in the 70s to 80s.  Usually his heart rate is in the 50s to 60s.  He has developed an uneasy feeling in his chest with some fatigue.  Today, he denies symptoms of palpitations, chest pain, shortness of breath, orthopnea, PND, lower extremity edema, claudication, dizziness, presyncope, syncope, bleeding, or neurologic sequela. The patient is tolerating medications without difficulties.    Past Medical History:  Diagnosis Date   Abnormal EKG 01/14/2018   Androgen deficiency    Chronic anticoagulation 10/24/2018   Dyslipidemia 01/05/2020   Elevated cholesterol    Hx of hypertrophic cardiomyopathy 09/15/2018   Minimal thickening of the apex with abnormal EKG based on the MRI    Paroxysmal atrial flutter ([place]) 01/05/2020   Tinnitus    Past Surgical History:  Procedure Laterality Date   CARDIOVERSION     CARDIOVERSION N/A 03/24/2021   Procedure: CARDIOVERSION;  Surgeon: Skeet Latch, MD;  Location: [facility];  Service: Cardiovascular;  Laterality: N/A;     Current Outpatient Medications  Medication Sig Dispense Refill   atorvastatin (LIPITOR) 10 MG tablet TAKE 1 TABLET BY MOUTH ONCE DAILY (Patient taking differently: Take 10 mg by mouth daily.) 90 tablet 3   B Complex-C-Folic Acid (SUPER B COMPLEX/FA/VIT C) TABS Take 1 tablet by mouth daily.     diltiazem (CARDIZEM) 60 MG tablet Take 1 tablet (60 mg total) by mouth 3 (three) times daily. 270 tablet 1   fexofenadine (ALLEGRA) 180 MG tablet Take 180 mg by mouth as needed for allergies or rhinitis.     Methylcobalamin (B-12) 5000 MCG TBDP Take 5,000 mcg by mouth daily.     Multiple Vitamin (MULTIVITAMIN) tablet Take 1 tablet by mouth daily.     nitroGLYCERIN (NITROSTAT) 0.4 MG SL tablet Dissolve 1 tablet (0.4 mg total) under the tongue every 5 (five) minutes as needed for chest pain (Patient taking differently: Place 0.4 mg under the tongue every 5 (five) minutes as needed for chest pain.) 25 tablet 0   oxymetazoline (AFRIN) 0.05 % nasal spray Place 1 spray into both nostrils daily as needed for congestion.     Psyllium (METAMUCIL FIBER PO) Take  6 capsules by mouth daily.     testosterone cypionate (DEPOTESTOTERONE CYPIONATE) 100 MG/ML injection Inject 1 mL (100 mg total) into the muscle every 14 (fourteen) days. (Patient taking differently: Inject 100 mg into the muscle every 14 (fourteen) days.) 10 mL 0   triamcinolone (NASACORT) 55 MCG/ACT AERO nasal inhaler Place 1 spray into the nose daily.     Vitamin D, Cholecalciferol, 50 MCG (2000 UT) CAPS Take 2,000 Units by mouth daily.     XARELTO 20 MG TABS tablet TAKE 1 TABLET BY MOUTH ONCE A DAY 90 tablet 1   No current facility-administered medications for this  visit.    Allergies:   Patient has no known allergies.   Social History:  The patient  reports that he has never smoked. He has never used smokeless tobacco. He reports current alcohol use of about 2.0 standard drinks per week.   Family History:  The patient's family history includes Atrial fibrillation in his mother; CAD in his father; Heart failure in his mother; Hyperlipidemia in his father; Idiopathic pulmonary fibrosis in his sister; Multiple myeloma in his father; Narcolepsy in his mother.    ROS:  Please see the history of present illness.   Otherwise, review of systems is positive for none.   All other systems are reviewed and negative.    PHYSICAL EXAM: VS:  BP 120/76    Pulse 76    Ht _0  (1.727 m)    Wt 192 lb 3.2 oz (87.2 kg)    SpO2 97%    BMI 29.22 kg/m  , BMI Body mass index is 29.22 kg/m. GEN: Well nourished, well developed, in no acute distress  HEENT: normal  Neck: no JVD, carotid bruits, or masses Cardiac: RRR; no murmurs, rubs, or gallops,no edema  Respiratory:  clear to auscultation bilaterally, normal work of breathing GI: soft, nontender, nondistended, + BS MS: no deformity or atrophy  Skin: warm and dry Neuro:  Strength and sensation are intact Psych: euthymic mood, full affect  EKG:  EKG is ordered today. Personal review of the ekg ordered shows atrial flutter  Recent Labs: 03/17/2021: BUN 22; Creatinine, Ser 1.12; Hemoglobin 17.4; Platelets 155; Potassium 3.8; Sodium 136    Lipid Panel  No results found for: CHOL, TRIG, HDL, CHOLHDL, VLDL, LDLCALC, LDLDIRECT   Wt Readings from Last 3 Encounters:  04/12/21 192 lb 3.2 oz (87.2 kg)  03/24/21 193 lb 3.2 oz (87.6 kg)  03/17/21 193 lb 3.2 oz (87.6 kg)      Other studies Reviewed: Additional studies/ records that were reviewed today include: TTE 01/29/20  Review of the above records today demonstrates:   1. Left ventricular ejection fraction, by estimation, is 60 to 65%. The  left ventricle has  normal function. The left ventricle has no regional  wall motion abnormalities. There is mild asymmetric left ventricular  hypertrophy of the posterior segment.  Left ventricular diastolic parameters are consistent with Grade II  diastolic dysfunction (pseudonormalization).   2. Right ventricular systolic function is normal. The right ventricular  size is normal.   3. The mitral valve is normal in structure. No evidence of mitral valve  regurgitation. No evidence of mitral stenosis.   4. The aortic valve is normal in structure. Aortic valve regurgitation is  not visualized. No aortic stenosis is present.   5. The inferior vena cava is normal in size with greater than 50%  respiratory variability, suggesting right atrial pressure of 3 mmHg.   ASSESSMENT AND PLAN:  1.  Typical appearing atrial flutter: CHA2DS2-VASc of 2.  Currently on Xarelto 20 mg daily.  He has had multiple episodes of atrial flutter.  He would like to be able to stop his anticoagulation.  Due to that, we Aimy Sweeting plan for ablation.  Risks and benefits were discussed which was bleeding, tamponade, heart block, stroke.  He understands these risks and has agreed to the procedure.  2.  Hypertrophic cardiomyopathy: Apical variant.  Has minimal LGE and no apical aneurysm.  We Elexa Kivi continue with plans per primary cardiology.  Case discussed with primary cardiology  Current medicines are reviewed at length with the patient today.   The patient does not have concerns regarding his medicines.  The following changes were made today:  none  Labs/ tests ordered today include:  Orders Placed This Encounter  Procedures   Basic metabolic panel   CBC   EKG 12-Lead      Disposition:   FU with Marquett Bertoli 3 months  Signed, Biana Haggar Meredith Leeds, MD  04/12/2021 10:57 AM     [place] [address] [phone] (office) [phone] (fax)

## 2021-04-12 NOTE — Patient Instructions (Signed)
Medication Instructions:  Your physician recommends that you continue on your current medications as directed. Please refer to the Current Medication list given to you today.  *If you need a refill on your cardiac medications before your next appointment, please call your pharmacy*   Lab Work: None ordered If you have labs (blood work) drawn today and your tests are completely normal, you will receive your results only by: [place] (if you have MyChart) OR A paper copy in the mail If you have any lab test that is abnormal or we need to change your treatment, we will call you to review the results.   Testing/Procedures: Your physician has recommended that you have an ablation. Catheter ablation is a medical procedure used to treat some cardiac arrhythmias (irregular heartbeats). During catheter ablation, a long, thin, flexible tube is put into a blood vessel in your groin (upper thigh), or neck. This tube is called an ablation catheter. It is then guided to your heart through the blood vessel. Radio frequency waves destroy small areas of heart tissue where abnormal heartbeats may cause an arrhythmia to start.  Please see the instruction sheet below located under "other instructions"   Follow-Up: At [facility], you and your health needs are our priority.  As part of our continuing mission to provide you with exceptional heart care, we have created designated Provider Care Teams.  These Care Teams include your primary Cardiologist (physician) and Advanced Practice Providers (APPs -  Physician Assistants and Nurse Practitioners) who all work together to provide you with the care you need, when you need it.  Your next appointment:   4 week(s) after your ablation  The format for your next appointment:   In Person  Provider:   Allegra Lai, MD    Thank you for choosing [facility]!!   Trinidad Curet, RN [phone]   Other Instructions      Electrophysiology/Ablation Procedure Instructions   You are scheduled for a(n)  ablation on 05/08/2021 with Dr. Allegra Lai.   1.   Pre procedure testing-             A.  LAB WORK --- On 04/12/21  for your pre procedure blood work.  You do NOT need to be fasting.   PROCEDURE DAY: On the day of your procedure 05/08/2021 you will go to [facility] [phone]) at 5:30 am.  Dennis Bast will go to the main entrance A [employer]) and enter where the [employer] are.  Your driver will drop you off and you will head down the hallway to ADMITTING.  You may have one support person come in to the hospital with you.  They will be asked to wait in the waiting room.  It is OK to have someone drop you off and come back when you are ready to be discharged.   3.   Do not eat or drink after midnight prior to your procedure.   4.   Do not miss any doses of your blood thinner prior to the morning of your procedure or your procedure will need to be rescheduled.       Do NOT take any medications the morning of your procedure.   5.  Plan for an overnight stay, but you may be discharged home after your procedure. If you use your phone frequently bring your phone charger, in case you have to stay.  If you are discharged after your procedure you will need someone  to drive you home and be with your for 24 hours after your procedure.   6. You will follow up with the AFIB clinic 4 weeks after your procedure.  You will follow up with Dr. Curt Bears  3 months after your procedure.  These appointments will be made for you.  7. FYI: For your safety, and to allow [place] to monitor your vital signs accurately during the surgery/procedure we request that if you have artificial nails, gel coating, SNS etc. Please have those removed prior to your surgery/procedure. Not having the nail coverings /polish removed may result in cancellation or delay of your surgery/procedure.   * If you have ANY questions please call the  office [phone] and ask for Estelita Iten RN or send me a MyChart message   * Occasionally, EP Studies and ablations can become lengthy.  Please make your family aware of this before your procedure starts.  Average time ranges from 2-8 hours for EP studies/ablations.  Your physician will call your family after the procedure with the results.                                    Cardiac Ablation Cardiac ablation is a procedure to destroy (ablate) some heart tissue that is sending bad signals. These bad signals cause problems in heart rhythm. The heart has many areas that make these signals. If there are problems in these areas, they can make the heart beat in a way that is not normal. Destroying some tissues can help make the heart rhythm normal. Tell your doctor about: Any allergies you have. All medicines you are taking. These include vitamins, herbs, eye drops, creams, and over-the-counter medicines. Any problems you or family members have had with medicines that make you fall asleep (anesthetics). Any blood disorders you have. Any surgeries you have had. Any medical conditions you have, such as kidney failure. Whether you are pregnant or may be pregnant. What are the risks? This is a safe procedure. But problems may occur, including: Infection. Bruising and bleeding. Bleeding into the chest. Stroke or blood clots. Damage to nearby areas of your body. Allergies to medicines or dyes. The need for a pacemaker if the normal system is damaged. Failure of the procedure to treat the problem. What happens before the procedure? Medicines Ask your doctor about: Changing or stopping your normal medicines. This is important. Taking aspirin and ibuprofen. Do not take these medicines unless your doctor tells you to take them. Taking other medicines, vitamins, herbs, and supplements. General instructions Follow instructions from your doctor about what you cannot eat or drink. Plan to have someone  take you home from the hospital or clinic. If you will be going home right after the procedure, plan to have someone with you for 24 hours. Ask your doctor what steps will be taken to prevent infection. What happens during the procedure?  An IV tube will be put into one of your veins. You will be given a medicine to help you relax. The skin on your neck or groin will be numbed. A cut (incision) will be made in your neck or groin. A needle will be put through your cut and into a large vein. A tube (catheter) will be put into the needle. The tube will be moved to your heart. Dye may be put through the tube. This helps your doctor see your heart. Small devices (electrodes) on the tube  will send out signals. A type of energy will be used to destroy some heart tissue. The tube will be taken out. Pressure will be held on your cut. This helps stop bleeding. A bandage will be put over your cut. The exact procedure may vary among doctors and hospitals. What happens after the procedure? You will be watched until you leave the hospital or clinic. This includes checking your heart rate, breathing rate, oxygen, and blood pressure. Your cut will be watched for bleeding. You will need to lie still for a few hours. Do not drive for 24 hours or as long as your doctor tells you. Summary Cardiac ablation is a procedure to destroy some heart tissue. This is done to treat heart rhythm problems. Tell your doctor about any medical conditions you may have. Tell him or her about all medicines you are taking to treat them. This is a safe procedure. But problems may occur. These include infection, bruising, bleeding, and damage to nearby areas of your body. Follow what your doctor tells you about food and drink. You may also be told to change or stop some of your medicines. After the procedure, do not drive for 24 hours or as long as your doctor tells you. This information is not intended to replace advice given to  you by your health care provider. Make sure you discuss any questions you have with your health care provider. Document Revised: 04/09/2019 Document Reviewed: 04/09/2019 Elsevier Patient Education  2022 [employer].

## 2021-04-12 NOTE — Progress Notes (Signed)
° °Electrophysiology Office Note ° ° °Date:  04/12/2021  ° °ID:  Evan Russell, DOB 05/30/1955, MRN [id number] ° °PCP:  Carter, Eric J, PA-C  °Cardiologist:  [name] °Primary Electrophysiologist:  [name], MD   ° °Chief Complaint: AF °  °History of Present Illness: °Evan Russell is a 65 y.o. male who is being seen today for the evaluation of AF at the request of [name], [name], MD. Presenting today for electrophysiology evaluation. ° °He has a history significant for apical variant hypertrophic cardiomyopathy initially diagnosed in 2019.  MRI did not show any late gadolinium enhancement.  He also has a history of atrial flutter.  He also has a history of CVA and is on Xarelto.  He presented to his primary cardiologist office and was found to be in typical atrial flutter.  He had a cardioversion performed in the early part of this year back to normal rhythm.  He was traveling in [place] for approximately 2 weeks.  He noted that he felt a little bit fatigued.  He checked his watch which showed him that he was out of rhythm with rapid heart rates.  He was told to take diltiazem.  He had a second cardioversion after that.  He had done well up until approximately a week ago when he noted that his baseline heart rate was in the 70s to 80s.  Usually his heart rate is in the 50s to 60s.  He has developed an uneasy feeling in his chest with some fatigue. ° °Today, he denies symptoms of palpitations, chest pain, shortness of breath, orthopnea, PND, lower extremity edema, claudication, dizziness, presyncope, syncope, bleeding, or neurologic sequela. The patient is tolerating medications without difficulties.  ° ° °Past Medical History:  °Diagnosis Date  ° Abnormal EKG 01/14/2018  ° Androgen deficiency   ° Chronic anticoagulation 10/24/2018  ° Dyslipidemia 01/05/2020  ° Elevated cholesterol   ° Hx of hypertrophic cardiomyopathy 09/15/2018  ° Minimal thickening of the apex with abnormal EKG based on the MRI  °  Paroxysmal atrial flutter (HCC) 01/05/2020  ° Tinnitus   ° °Past Surgical History:  °Procedure Laterality Date  ° CARDIOVERSION    ° CARDIOVERSION N/A 03/24/2021  ° Procedure: CARDIOVERSION;  Surgeon: [place], Tiffany, MD;  Location: [facility];  Service: Cardiovascular;  Laterality: N/A;  ° ° ° °Current Outpatient Medications  °Medication Sig Dispense Refill  ° atorvastatin (LIPITOR) 10 MG tablet TAKE 1 TABLET BY MOUTH ONCE DAILY (Patient taking differently: Take 10 mg by mouth daily.) 90 tablet 3  ° B Complex-C-Folic Acid (SUPER B COMPLEX/FA/VIT C) TABS Take 1 tablet by mouth daily.    ° diltiazem (CARDIZEM) 60 MG tablet Take 1 tablet (60 mg total) by mouth 3 (three) times daily. 270 tablet 1  ° fexofenadine (ALLEGRA) 180 MG tablet Take 180 mg by mouth as needed for allergies or rhinitis.    ° Methylcobalamin (B-12) 5000 MCG TBDP Take 5,000 mcg by mouth daily.    ° Multiple Vitamin (MULTIVITAMIN) tablet Take 1 tablet by mouth daily.    ° nitroGLYCERIN (NITROSTAT) 0.4 MG SL tablet Dissolve 1 tablet (0.4 mg total) under the tongue every 5 (five) minutes as needed for chest pain (Patient taking differently: Place 0.4 mg under the tongue every 5 (five) minutes as needed for chest pain.) 25 tablet 0  ° oxymetazoline (AFRIN) 0.05 % nasal spray Place 1 spray into both nostrils daily as needed for congestion.    ° Psyllium (METAMUCIL FIBER PO) Take   6 capsules by mouth daily.    ° testosterone cypionate (DEPOTESTOTERONE CYPIONATE) 100 MG/ML injection Inject 1 mL (100 mg total) into the muscle every 14 (fourteen) days. (Patient taking differently: Inject 100 mg into the muscle every 14 (fourteen) days.) 10 mL 0  ° triamcinolone (NASACORT) 55 MCG/ACT AERO nasal inhaler Place 1 spray into the nose daily.    ° Vitamin D, Cholecalciferol, 50 MCG (2000 UT) CAPS Take 2,000 Units by mouth daily.    ° XARELTO 20 MG TABS tablet TAKE 1 TABLET BY MOUTH ONCE A DAY 90 tablet 1  ° °No current facility-administered medications for this  visit.  ° ° °Allergies:   Patient has no known allergies.  ° °Social History:  The patient  reports that he has never smoked. He has never used smokeless tobacco. He reports current alcohol use of about 2.0 standard drinks per week.  ° °Family History:  The patient's family history includes Atrial fibrillation in his mother; CAD in his father; Heart failure in his mother; Hyperlipidemia in his father; Idiopathic pulmonary fibrosis in his sister; Multiple myeloma in his father; Narcolepsy in his mother.  ° ° °ROS:  Please see the history of present illness.   Otherwise, review of systems is positive for none.   All other systems are reviewed and negative.  ° ° °PHYSICAL EXAM: °VS:  BP 120/76    Pulse 76    Ht 5' 8" (1.727 m)    Wt 192 lb 3.2 oz (87.2 kg)    SpO2 97%    BMI 29.22 kg/m²  , BMI Body mass index is 29.22 kg/m². °GEN: Well nourished, well developed, in no acute distress  °HEENT: normal  °Neck: no JVD, carotid bruits, or masses °Cardiac: RRR; no murmurs, rubs, or gallops,no edema  °Respiratory:  clear to auscultation bilaterally, normal work of breathing °GI: soft, nontender, nondistended, + BS °MS: no deformity or atrophy  °Skin: warm and dry °Neuro:  Strength and sensation are intact °Psych: euthymic mood, full affect ° °EKG:  EKG is ordered today. °Personal review of the ekg ordered shows atrial flutter ° °Recent Labs: °03/17/2021: BUN 22; Creatinine, Ser 1.12; Hemoglobin 17.4; Platelets 155; Potassium 3.8; Sodium 136  ° ° °Lipid Panel  °No results found for: CHOL, TRIG, HDL, CHOLHDL, VLDL, LDLCALC, LDLDIRECT ° ° °Wt Readings from Last 3 Encounters:  °04/12/21 192 lb 3.2 oz (87.2 kg)  °03/24/21 193 lb 3.2 oz (87.6 kg)  °03/17/21 193 lb 3.2 oz (87.6 kg)  °  ° ° °Other studies Reviewed: °Additional studies/ records that were reviewed today include: TTE 01/29/20  °Review of the above records today demonstrates:  ° 1. Left ventricular ejection fraction, by estimation, is 60 to 65%. The  °left ventricle has  normal function. The left ventricle has no regional  °wall motion abnormalities. There is mild asymmetric left ventricular  °hypertrophy of the posterior segment.  °Left ventricular diastolic parameters are consistent with Grade II  °diastolic dysfunction (pseudonormalization).  ° 2. Right ventricular systolic function is normal. The right ventricular  °size is normal.  ° 3. The mitral valve is normal in structure. No evidence of mitral valve  °regurgitation. No evidence of mitral stenosis.  ° 4. The aortic valve is normal in structure. Aortic valve regurgitation is  °not visualized. No aortic stenosis is present.  ° 5. The inferior vena cava is normal in size with greater than 50%  °respiratory variability, suggesting right atrial pressure of 3 mmHg. ° ° °ASSESSMENT AND PLAN: ° °  1.  Typical appearing atrial flutter: CHA2DS2-VASc of 2.  Currently on Xarelto 20 mg daily.  He has had multiple episodes of atrial flutter.  He would like to be able to stop his anticoagulation.  Due to that, we Shunsuke Granzow plan for ablation.  Risks and benefits were discussed which was bleeding, tamponade, heart block, stroke.  He understands these risks and has agreed to the procedure. ° °2.  Hypertrophic cardiomyopathy: Apical variant.  Has minimal LGE and no apical aneurysm.  We Ayala Ribble continue with plans per primary cardiology. ° °Case discussed with primary cardiology ° °Current medicines are reviewed at length with the patient today.   °The patient does not have concerns regarding his medicines.  The following changes were made today:  none ° °Labs/ tests ordered today include:  °Orders Placed This Encounter  °Procedures  ° Basic metabolic panel  ° CBC  ° EKG 12-Lead  ° ° ° ° °Disposition:   FU with Raquel Racey 3 months ° °Signed, °Legacy Carrender Martin Dabria Wadas, MD  °04/12/2021 10:57 AM    ° °[facility] °[address] °[address] °[place] [address] °[phone] (office) °[phone] (fax) ° °

## 2021-04-17 DIAGNOSIS — E291 Testicular hypofunction: Secondary | ICD-10-CM | POA: Diagnosis not present

## 2021-04-25 ENCOUNTER — Other Ambulatory Visit (HOSPITAL_COMMUNITY): Payer: Self-pay

## 2021-04-25 ENCOUNTER — Other Ambulatory Visit: Payer: Self-pay | Admitting: Cardiology

## 2021-04-25 MED ORDER — XARELTO 20 MG PO TABS
20.0000 mg | ORAL_TABLET | Freq: Every day | ORAL | 1 refills | Status: DC
  Filled 2021-04-25: qty 90, 90d supply, fill #0
  Filled 2021-06-09: qty 90, 90d supply, fill #1

## 2021-04-25 NOTE — Telephone Encounter (Signed)
Prescription refill request for Xarelto received.  Indication: afib  Last office visit: aflutter, 04/12/2021 Weight: 87.2 kg  Age: 65 yo  Scr: 1.05, 04/12/2021 CrCl: 85 ml/min   Refill sent.

## 2021-05-02 ENCOUNTER — Encounter: Payer: Self-pay | Admitting: Cardiology

## 2021-05-02 DIAGNOSIS — E291 Testicular hypofunction: Secondary | ICD-10-CM | POA: Diagnosis not present

## 2021-05-08 ENCOUNTER — Ambulatory Visit (HOSPITAL_COMMUNITY): Payer: 59 | Admitting: Certified Registered Nurse Anesthetist

## 2021-05-08 ENCOUNTER — Ambulatory Visit (HOSPITAL_COMMUNITY)
Admission: RE | Admit: 2021-05-08 | Discharge: 2021-05-08 | Disposition: A | Discharge status: Home / Self Care | Payer: 59 | Attending: Cardiology | Admitting: Cardiology

## 2021-05-08 ENCOUNTER — Encounter (HOSPITAL_COMMUNITY)
Admission: RE | Disposition: A | Discharge status: Home / Self Care | Payer: Self-pay | Source: Home / Self Care | Attending: Cardiology

## 2021-05-08 ENCOUNTER — Other Ambulatory Visit: Payer: Self-pay

## 2021-05-08 DIAGNOSIS — I471 Supraventricular tachycardia: Secondary | ICD-10-CM | POA: Diagnosis not present

## 2021-05-08 DIAGNOSIS — I4892 Unspecified atrial flutter: Secondary | ICD-10-CM | POA: Diagnosis not present

## 2021-05-08 DIAGNOSIS — I422 Other hypertrophic cardiomyopathy: Secondary | ICD-10-CM | POA: Diagnosis not present

## 2021-05-08 DIAGNOSIS — Z7901 Long term (current) use of anticoagulants: Secondary | ICD-10-CM | POA: Insufficient documentation

## 2021-05-08 DIAGNOSIS — I483 Typical atrial flutter: Secondary | ICD-10-CM | POA: Insufficient documentation

## 2021-05-08 HISTORY — PX: A-FLUTTER ABLATION: EP1230

## 2021-05-08 SURGERY — A-FLUTTER ABLATION
Anesthesia: General

## 2021-05-08 MED ORDER — MIDAZOLAM HCL 2 MG/2ML IJ SOLN
INTRAMUSCULAR | Status: DC | PRN
  Administered 2021-05-08: 2 mg via INTRAVENOUS

## 2021-05-08 MED ORDER — FENTANYL CITRATE (PF) 250 MCG/5ML IJ SOLN
INTRAMUSCULAR | Status: DC | PRN
  Administered 2021-05-08: 100 ug via INTRAVENOUS

## 2021-05-08 MED ORDER — ACETAMINOPHEN 325 MG PO TABS: 650.0000 mg | ORAL_TABLET | ORAL | Status: DC | PRN

## 2021-05-08 MED ORDER — ONDANSETRON HCL 4 MG/2ML IJ SOLN: 4.0000 mg | Freq: Four times a day (QID) | INTRAMUSCULAR | Status: DC | PRN

## 2021-05-08 MED ORDER — DEXAMETHASONE SODIUM PHOSPHATE 10 MG/ML IJ SOLN
INTRAMUSCULAR | Status: DC | PRN
  Administered 2021-05-08: 8 mg via INTRAVENOUS

## 2021-05-08 MED ORDER — SODIUM CHLORIDE 0.9 % IV SOLN: INTRAVENOUS | Status: DC

## 2021-05-08 MED ORDER — SODIUM CHLORIDE 0.9% FLUSH: 3.0000 mL | INTRAVENOUS | Status: DC | PRN

## 2021-05-08 MED ORDER — HEPARIN (PORCINE) IN NACL 1000-0.9 UT/500ML-% IV SOLN
INTRAVENOUS | Status: DC | PRN
  Administered 2021-05-08: 500 mL

## 2021-05-08 MED ORDER — PHENYLEPHRINE HCL-NACL 20-0.9 MG/250ML-% IV SOLN
INTRAVENOUS | Status: DC | PRN
  Administered 2021-05-08: 40 ug/min via INTRAVENOUS

## 2021-05-08 MED ORDER — PROPOFOL 10 MG/ML IV BOLUS
INTRAVENOUS | Status: DC | PRN
  Administered 2021-05-08: 150 mg via INTRAVENOUS

## 2021-05-08 MED ORDER — ONDANSETRON HCL 4 MG/2ML IJ SOLN
INTRAMUSCULAR | Status: DC | PRN
  Administered 2021-05-08: 4 mg via INTRAVENOUS

## 2021-05-08 MED ORDER — SUGAMMADEX SODIUM 200 MG/2ML IV SOLN
INTRAVENOUS | Status: DC | PRN
  Administered 2021-05-08: 200 mg via INTRAVENOUS

## 2021-05-08 MED ORDER — SODIUM CHLORIDE 0.9% FLUSH: 3.0000 mL | Freq: Two times a day (BID) | INTRAVENOUS | Status: DC

## 2021-05-08 MED ORDER — ROCURONIUM BROMIDE 10 MG/ML (PF) SYRINGE
PREFILLED_SYRINGE | INTRAVENOUS | Status: DC | PRN
  Administered 2021-05-08: 20 mg via INTRAVENOUS
  Administered 2021-05-08: 50 mg via INTRAVENOUS

## 2021-05-08 MED ORDER — SODIUM CHLORIDE 0.9 % IV SOLN: 250.0000 mL | INTRAVENOUS | Status: DC | PRN

## 2021-05-08 MED ORDER — LIDOCAINE 2% (20 MG/ML) 5 ML SYRINGE
INTRAMUSCULAR | Status: DC | PRN
Start: 2021-05-08 — End: 2021-05-08
  Administered 2021-05-08: 60 mg via INTRAVENOUS

## 2021-05-08 SURGICAL SUPPLY — 11 items
CATH EZ STEER NAV 8MM F-J CUR (ABLATOR) ×2 IMPLANT
CATH WEBSTER BI DIR CS D-F CRV (CATHETERS) ×2 IMPLANT
CLOSURE PERCLOSE PROSTYLE (VASCULAR PRODUCTS) ×4 IMPLANT
PACK EP LATEX FREE (CUSTOM PROCEDURE TRAY) ×2
PACK EP LF (CUSTOM PROCEDURE TRAY) ×1 IMPLANT
PAD DEFIB RADIO PHYSIO CONN (PAD) ×3 IMPLANT
PATCH CARTO3 (PAD) ×2 IMPLANT
SHEATH PINNACLE 7F 10CM (SHEATH) ×2 IMPLANT
SHEATH PINNACLE 8F 10CM (SHEATH) ×2 IMPLANT
SHEATH PROBE COVER 6X72 (BAG) ×2 IMPLANT
SHEATH SWARTZ RAMP 8.5F 60CM (SHEATH) ×2 IMPLANT

## 2021-05-08 NOTE — Anesthesia Preprocedure Evaluation (Signed)
Anesthesia Evaluation  Patient identified by MRN, date of birth, ID band Patient awake    Reviewed: Allergy & Precautions, H&P , NPO status , Patient's Chart, lab work & pertinent test results  Airway Mallampati: II   Neck ROM: full    Dental   Pulmonary neg pulmonary ROS,    breath sounds clear to auscultation       Cardiovascular + dysrhythmias Atrial Fibrillation  Rhythm:irregular Rate:Normal     Neuro/Psych    GI/Hepatic   Endo/Other    Renal/GU      Musculoskeletal   Abdominal   Peds  Hematology   Anesthesia Other Findings   Reproductive/Obstetrics                             Anesthesia Physical Anesthesia Plan  ASA: 2  Anesthesia Plan: General   Post-op Pain Management:    Induction: Intravenous  PONV Risk Score and Plan: 2 and Ondansetron, Dexamethasone, Midazolam and Treatment may vary due to age or medical condition  Airway Management Planned: Oral ETT  Additional Equipment:   Intra-op Plan:   Post-operative Plan: Extubation in OR  Informed Consent: I have reviewed the patients History and Physical, chart, labs and discussed the procedure including the risks, benefits and alternatives for the proposed anesthesia with the patient or authorized representative who has indicated his/her understanding and acceptance.     Dental advisory given  Plan Discussed with: CRNA, Anesthesiologist and Surgeon  Anesthesia Plan Comments:         Anesthesia Quick Evaluation

## 2021-05-08 NOTE — Discharge Instructions (Signed)
Post procedure care instructions No driving for 4 days. No lifting over 5 lbs for 1 week. No vigorous or sexual activity for 1 week. You may return to work/your usual activities on 05/16/21. Keep procedure site clean & dry. If you notice increased pain, swelling, bleeding or pus, call/return!  You may shower after 24 hours, but no soaking in baths/hot tubs/pools for 1 week.

## 2021-05-08 NOTE — Transfer of Care (Signed)
Immediate Anesthesia Transfer of Care Note  Patient: Evan Russell  Procedure(s) Performed: A-FLUTTER ABLATION  Patient Location: Cath Lab  Anesthesia Type:General  Level of Consciousness: awake, alert  and oriented  Airway & Oxygen Therapy: Patient Spontanous Breathing and Patient connected to nasal cannula oxygen  Post-op Assessment: Report given to RN, Post -op Vital signs reviewed and stable and Patient moving all extremities  Post vital signs: Reviewed and stable  Last Vitals:  Vitals Value Taken Time  BP    Temp    Pulse 75 05/08/21 1258  Resp 16 05/08/21 1258  SpO2 92 % 05/08/21 1258  Vitals shown include unvalidated device data.  Last Pain:  Vitals:   05/08/21 1055  TempSrc:   PainSc: 0-No pain         Complications: There were no known notable events for this encounter.

## 2021-05-08 NOTE — Interval H&P Note (Signed)
History and Physical Interval Note:  05/08/2021 10:15 AM  Evan Russell  has presented today for surgery, with the diagnosis of aflutter.  The various methods of treatment have been discussed with the patient and family. After consideration of risks, benefits and other options for treatment, the patient has consented to  Procedure(s): A-FLUTTER ABLATION (N/A) as a surgical intervention.  The patient's history has been reviewed, patient examined, no change in status, stable for surgery.  I have reviewed the patient's chart and labs.  Questions were answered to the patient's satisfaction.     Lavra Imler [employer]

## 2021-05-08 NOTE — Anesthesia Procedure Notes (Signed)
Procedure Name: Intubation Date/Time: 05/08/2021 11:05 AM Performed by: Leonor Liv, CRNA Pre-anesthesia Checklist: Patient identified, Emergency Drugs available, Suction available and Patient being monitored Patient Re-evaluated:Patient Re-evaluated prior to induction Oxygen Delivery Method: Circle System Utilized Preoxygenation: Pre-oxygenation with 100% oxygen Induction Type: IV induction Ventilation: Mask ventilation without difficulty and Oral airway inserted - appropriate to patient size Laryngoscope Size: Mac and 4 Grade View: Grade I Tube type: Oral Tube size: 7.5 mm Number of attempts: 1 Airway Equipment and Method: Stylet and Oral airway Placement Confirmation: ETT inserted through vocal cords under direct vision, positive ETCO2 and breath sounds checked- equal and bilateral Secured at: 23 cm Tube secured with: Tape Dental Injury: Teeth and Oropharynx as per pre-operative assessment

## 2021-05-09 ENCOUNTER — Encounter (HOSPITAL_COMMUNITY): Payer: Self-pay | Admitting: Cardiology

## 2021-05-09 ENCOUNTER — Encounter: Payer: Self-pay | Admitting: Cardiology

## 2021-05-10 ENCOUNTER — Telehealth: Payer: Self-pay

## 2021-05-10 NOTE — Anesthesia Postprocedure Evaluation (Signed)
Anesthesia Post Note  Patient: Evan Russell  Procedure(s) Performed: A-FLUTTER ABLATION     Patient location during evaluation: Cath Lab Anesthesia Type: General Level of consciousness: awake and alert Pain management: pain level controlled Vital Signs Assessment: post-procedure vital signs reviewed and stable Respiratory status: spontaneous breathing, nonlabored ventilation, respiratory function stable and patient connected to nasal cannula oxygen Cardiovascular status: blood pressure returned to baseline and stable Postop Assessment: no apparent nausea or vomiting Anesthetic complications: no   There were no known notable events for this encounter.  Last Vitals:  Vitals:   05/08/21 1500 05/08/21 1531  BP: 115/71 108/67  Pulse: 81 79  Resp: 14 15  Temp:    SpO2: 95% 96%    Last Pain:  Vitals:   05/08/21 1342  TempSrc:   PainSc: 0-No pain   Pain Goal:                   Denielle Bayard S

## 2021-05-10 NOTE — Telephone Encounter (Signed)
Spoke with pt who states that he feels much better today. Pt states that the fogginess has improved and the discomfort at the incision site was from the tape. Pt states that he has an appointment with Dr. Curt Bears 06/09/21.

## 2021-05-12 ENCOUNTER — Telehealth: Payer: Self-pay

## 2021-05-12 NOTE — Telephone Encounter (Signed)
The patient would like for Dr. Curt Bears nurse to give him a call to let him know how much can he walk? Can he walk around the block, or just around the house. I told him I will send the nurse a message and someone will reach out to him.

## 2021-05-12 NOTE — Telephone Encounter (Signed)
Pt aware ok to go to the grocery store today to pick up a few items. Aware ok to walk around the block if also wanted. Patient verbalized understanding and agreeable to plan.

## 2021-05-15 ENCOUNTER — Encounter: Payer: Self-pay | Admitting: Cardiology

## 2021-05-16 DIAGNOSIS — E291 Testicular hypofunction: Secondary | ICD-10-CM | POA: Diagnosis not present

## 2021-05-22 ENCOUNTER — Other Ambulatory Visit (HOSPITAL_COMMUNITY): Payer: Self-pay

## 2021-05-30 DIAGNOSIS — E291 Testicular hypofunction: Secondary | ICD-10-CM | POA: Diagnosis not present

## 2021-06-06 ENCOUNTER — Ambulatory Visit: Payer: 59 | Admitting: Cardiology

## 2021-06-09 ENCOUNTER — Ambulatory Visit: Payer: Medicare Other | Admitting: Cardiology

## 2021-06-09 ENCOUNTER — Other Ambulatory Visit (HOSPITAL_COMMUNITY): Payer: Self-pay

## 2021-06-09 ENCOUNTER — Encounter: Payer: Self-pay | Admitting: Cardiology

## 2021-06-09 ENCOUNTER — Ambulatory Visit (INDEPENDENT_AMBULATORY_CARE_PROVIDER_SITE_OTHER): Payer: Medicare Other

## 2021-06-09 ENCOUNTER — Other Ambulatory Visit: Payer: Self-pay

## 2021-06-09 VITALS — BP 128/76 | HR 69 | Ht 68.0 in | Wt 197.8 lb

## 2021-06-09 DIAGNOSIS — I483 Typical atrial flutter: Secondary | ICD-10-CM | POA: Diagnosis not present

## 2021-06-09 NOTE — Patient Instructions (Addendum)
Medication Instructions:  Your physician has recommended you make the following change in your medication:  STOP Diltiazem  *If you need a refill on your cardiac medications before your next appointment, please call your pharmacy*   Lab Work: None ordered If you have labs (blood work) drawn today and your tests are completely normal, you will receive your results only by: [place] (if you have MyChart) OR A paper copy in the mail If you have any lab test that is abnormal or we need to change your treatment, we will call you to review the results.   Testing/Procedures:                           Your physician has recommended that you wear a 14 day(s)/ week(s) heart monitor (ZIO patch) - This will be mailed directly to your home address/ placed in office today - You may receive a call directly from the company for [place], which is [employer] within the next few day- if you see an 800# or 224# calling, please answer - Once the monitor is applied and activated it will record your heart rate/ rhythm for the duration that you are wearing it - If you are having any symptoms (dizziness/ lightheadedness/ palpitations/ racing heart/ anything that just doesn't feel right) then you will push the button in the center of the monitor to mark you were having a symptom - Please DO NOT shower for 24 hours after the monitor is placed - No tub baths/ swimming pools/ hot tubs while wearing the monitor - Do not put any lotions, oils, or ointments around the monitor - If you have any issues with the monitor itself, then please call the 800 # for the company - After 14 day(s)/ week(s) please take the monitor off at home and mail it back ([place] mail) in the box provided by [employer], postage is already paid.    Follow-Up: At [facility], you and your health needs are our priority.  As part of our continuing mission to provide you with exceptional heart care, we have created designated Provider Care Teams.   These Care Teams include your primary Cardiologist (physician) and Advanced Practice Providers (APPs -  Physician Assistants and Nurse Practitioners) who all work together to provide you with the care you need, when you need it.  Your next appointment:   To be   determined  The format for your next appointment:   In Person  Provider:   Allegra Lai, MD    Thank you for choosing [facility]!!   Trinidad Curet, RN [phone]   Other Instructions   ZIO XT- Long Term Monitor Instructions  Your physician has requested you wear a ZIO patch monitor for 14 days.  This is a single patch monitor. [employer] supplies one patch monitor per enrollment. Additional stickers are not available. Please do not apply patch if you will be having a Nuclear Stress Test,  Echocardiogram, Cardiac CT, MRI, or Chest Xray during the period you would be wearing the  monitor. The patch cannot be worn during these tests. You cannot remove and re-apply the  ZIO XT patch monitor.  Your ZIO patch monitor will be mailed 3 day USPS to your address on file. It may take 3-5 days  to receive your monitor after you have been enrolled.  Once you have received your monitor, please review the enclosed instructions. Your monitor  has already been registered assigning a specific  monitor serial # to you.  Billing and Patient Assistance Program Information  We have supplied [employer] with any of your insurance information on file for billing purposes. [employer] offers a sliding scale Patient Assistance Program for patients that do not have  insurance, or whose insurance does not completely cover the cost of the ZIO monitor.  You must apply for the Patient Assistance Program to qualify for this discounted rate.  To apply, please call [employer] at [phone], select option 4, select option 2, ask to apply for  Patient Assistance Program. Theodore Demark will ask your household income, and how many people  are in your household.  They will quote your out-of-pocket cost based on that information.  [employer] will also be able to set up a 91-month, interest-free payment plan if needed.  Applying the monitor   Shave hair from upper left chest.  Hold abrader disc by orange tab. Rub abrader in 40 strokes over the upper left chest as  indicated in your monitor instructions.  Clean area with 4 enclosed alcohol pads. Let dry.  Apply patch as indicated in monitor instructions. Patch will be placed under collarbone on left  side of chest with arrow pointing upward.  Rub patch adhesive wings for 2 minutes. Remove white label marked "1". Remove the white  label marked "2". Rub patch adhesive wings for 2 additional minutes.  While looking in a mirror, press and release button in center of patch. A small green light will  flash 3-4 times. This will be your only indicator that the monitor has been turned on.  Do not shower for the first 24 hours. You may shower after the first 24 hours.  Press the button if you feel a symptom. You will hear a small click. Record Date, Time and  Symptom in the Patient Logbook.  When you are ready to remove the patch, follow instructions on the last 2 pages of Patient  Logbook. Stick patch monitor onto the last page of Patient Logbook.  Place Patient Logbook in the blue and white box. Use locking tab on box and tape box closed  securely. The blue and white box has prepaid postage on it. Please place it in the mailbox as  soon as possible. Your physician should have your test results approximately 7 days after the  monitor has been mailed back to [facility].  Call [place] at [phone] if you have questions regarding  your ZIO XT patch monitor. Call them immediately if you see an orange light blinking on your  monitor.  If your monitor falls off in less than 4 days, contact our Monitor department at [phone].  If your monitor becomes loose or falls off after 4 days call  [employer] at [phone] for  suggestions on securing your monitor

## 2021-06-09 NOTE — Progress Notes (Signed)
Electrophysiology Office Note   Date:  06/09/2021   ID:  CHAY MAZZONI, DOB 07-26-1955, MRN [id number]  PCP:  [name], PA-C  Cardiologist:  Agustin Cree Primary Electrophysiologist:  Shiraz Bastyr Meredith Leeds, MD    Chief Complaint: AF   History of Present Illness: Evan Russell is a 66 y.o. male who is being seen today for the evaluation of AF at the request of Briant Sites. Presenting today for electrophysiology evaluation.  He has a history significant for apical variant hypertrophic cardiomyopathy initially diagnosed in 2019.  MRI did not show late gadolinium enhancement and he did not have an apical aneurysm.  He has atrial flutter.  He has a history of CVA and is on Xarelto.  He presented to his primary cardiologist office and was noted to be in atrial flutter.  He had a cardioversion but unfortunate went back into atrial flutter.  He is now status post ablation 05/08/2021.  Today, denies symptoms of palpitations, chest pain, shortness of breath, orthopnea, PND, lower extremity edema, claudication, dizziness, presyncope, syncope, bleeding, or neurologic sequela. The patient is tolerating medications without difficulties.  He does not feel like he has had any further episodes of atrial flutter.  He is overall happy with his control.  He does have heart rate recordings from his watch that show irregular rhythms.  His watch is recording atrial fibrillation.  He has intermittent palpitations.  Most the time he feels well and these episodes are 2 weeks apart generally.   Past Medical History:  Diagnosis Date   Abnormal EKG 01/14/2018   Androgen deficiency    Chronic anticoagulation 10/24/2018   Dyslipidemia 01/05/2020   Elevated cholesterol    Hx of hypertrophic cardiomyopathy 09/15/2018   Minimal thickening of the apex with abnormal EKG based on the MRI   Paroxysmal atrial flutter ([place]) 01/05/2020   Tinnitus    Past Surgical History:  Procedure Laterality Date   A-FLUTTER ABLATION  N/A 05/08/2021   Procedure: A-FLUTTER ABLATION;  Surgeon: Constance Haw, MD;  Location: [facility];  Service: Cardiovascular;  Laterality: N/A;   CARDIOVERSION     CARDIOVERSION N/A 03/24/2021   Procedure: CARDIOVERSION;  Surgeon: Skeet Latch, MD;  Location: [facility];  Service: Cardiovascular;  Laterality: N/A;     Current Outpatient Medications  Medication Sig Dispense Refill   atorvastatin (LIPITOR) 10 MG tablet TAKE 1 TABLET BY MOUTH ONCE DAILY (Patient taking differently: Take 10 mg by mouth at bedtime.) 90 tablet 3   B Complex-C-Folic Acid (SUPER B COMPLEX/FA/VIT C) TABS Take 1 tablet by mouth daily.     fexofenadine (ALLEGRA) 180 MG tablet Take 180 mg by mouth as needed for allergies or rhinitis.     Methylcobalamin (B-12) 5000 MCG TBDP Take 5,000 mcg by mouth daily.     Multiple Vitamin (MULTIVITAMIN) tablet Take 1 tablet by mouth daily.     nitroGLYCERIN (NITROSTAT) 0.4 MG SL tablet Dissolve 1 tablet (0.4 mg total) under the tongue every 5 (five) minutes as needed for chest pain (Patient taking differently: Place 0.4 mg under the tongue every 5 (five) minutes as needed for chest pain.) 25 tablet 0   oxymetazoline (AFRIN) 0.05 % nasal spray Place 1 spray into both nostrils daily as needed for congestion.     Psyllium (METAMUCIL FIBER PO) Take 6 capsules by mouth daily.     testosterone cypionate (DEPOTESTOTERONE CYPIONATE) 100 MG/ML injection Inject 1 mL (100 mg total) into the muscle every 14 (fourteen)  days. (Patient taking differently: Inject 100 mg into the muscle every 14 (fourteen) days.) 10 mL 0   triamcinolone (NASACORT) 55 MCG/ACT AERO nasal inhaler Place 1 spray into the nose daily as needed (Sinus/ allergies).     Vitamin D, Cholecalciferol, 50 MCG (2000 UT) CAPS Take 2,000 Units by mouth daily.     XARELTO 20 MG TABS tablet TAKE 1 TABLET BY MOUTH ONCE A DAY (Patient taking differently: Take 20 mg by mouth daily with supper.) 90 tablet 1   No current  facility-administered medications for this visit.    Allergies:   Patient has no known allergies.   Social History:  The patient  reports that he has never smoked. He has never used smokeless tobacco. He reports current alcohol use of about 2.0 standard drinks per week.   Family History:  The patient's family history includes Atrial fibrillation in his mother; CAD in his father; Heart failure in his mother; Hyperlipidemia in his father; Idiopathic pulmonary fibrosis in his sister; Multiple myeloma in his father; Narcolepsy in his mother.    ROS:  Please see the history of present illness.   Otherwise, review of systems is positive for none.   All other systems are reviewed and negative.   PHYSICAL EXAM: VS:  BP 128/76    Pulse 69    Ht '5\' 8"'  (1.727 m)    Wt 197 lb 12.8 oz (89.7 kg)    SpO2 95%    BMI 30.08 kg/m  , BMI Body mass index is 30.08 kg/m. GEN: Well nourished, well developed, in no acute distress  HEENT: normal  Neck: no JVD, carotid bruits, or masses Cardiac: RRR; no murmurs, rubs, or gallops,no edema  Respiratory:  clear to auscultation bilaterally, normal work of breathing GI: soft, nontender, nondistended, + BS MS: no deformity or atrophy  Skin: warm and dry Neuro:  Strength and sensation are intact Psych: euthymic mood, full affect  EKG:  EKG is ordered today. Personal review of the ekg ordered shows sinus rhythm, T wave inversions  Recent Labs: 04/12/2021: BUN 20; Creatinine, Ser 1.05; Hemoglobin 16.7; Platelets 191; Potassium 4.1; Sodium 139    Lipid Panel  No results found for: CHOL, TRIG, HDL, CHOLHDL, VLDL, LDLCALC, LDLDIRECT   Wt Readings from Last 3 Encounters:  06/09/21 197 lb 12.8 oz (89.7 kg)  05/08/21 188 lb (85.3 kg)  04/12/21 192 lb 3.2 oz (87.2 kg)      Other studies Reviewed: Additional studies/ records that were reviewed today include: TTE 01/29/20  Review of the above records today demonstrates:   1. Left ventricular ejection fraction, by  estimation, is 60 to 65%. The  left ventricle has normal function. The left ventricle has no regional  wall motion abnormalities. There is mild asymmetric left ventricular  hypertrophy of the posterior segment.  Left ventricular diastolic parameters are consistent with Grade II  diastolic dysfunction (pseudonormalization).   2. Right ventricular systolic function is normal. The right ventricular  size is normal.   3. The mitral valve is normal in structure. No evidence of mitral valve  regurgitation. No evidence of mitral stenosis.   4. The aortic valve is normal in structure. Aortic valve regurgitation is  not visualized. No aortic stenosis is present.   5. The inferior vena cava is normal in size with greater than 50%  respiratory variability, suggesting right atrial pressure of 3 mmHg.   ASSESSMENT AND PLAN:  1.  Typical atrial flutter: CHA2DS2-VASc of 2.  Currently on Xarelto  20 mg daily.  Status post ablation 05/08/2021.  He is in sinus rhythm today.  He brings in watch heart rates that are indicative of possible atrial fibrillation.  We Evan Russell stop his diltiazem today and have him wear a 2-week monitor.  2.  Hypertrophic cardiomyopathy: Apical variant.  Has minimal LGE and no apical aneurysm.  We Evan Russell continue with plans per primary cardiology.   Current medicines are reviewed at length with the patient today.   The patient does not have concerns regarding his medicines.  The following changes were made today: Stop diltiazem  Labs/ tests ordered today include:  Orders Placed This Encounter  Procedures   LONG TERM MONITOR (3-14 DAYS)   EKG 12-Lead      Disposition:   FU with Evan Russell 3 months  Signed, Trisha Ken Meredith Leeds, MD  06/09/2021 4:54 PM     [address] [place] [address] [phone] (office) [phone] (fax)

## 2021-06-09 NOTE — Progress Notes (Unsigned)
Gave patient office inventory preventice long term holter monitor. Also supplied 2 bridge patches and sensitive skin electrodes.  [employer] mini EL [id number]

## 2021-06-09 NOTE — Addendum Note (Signed)
Addended by: Stanton Kidney on: 06/09/2021 05:02 PM   Modules accepted: Orders

## 2021-06-12 ENCOUNTER — Other Ambulatory Visit (HOSPITAL_COMMUNITY): Payer: Self-pay

## 2021-06-13 DIAGNOSIS — I483 Typical atrial flutter: Secondary | ICD-10-CM

## 2021-06-15 ENCOUNTER — Other Ambulatory Visit (HOSPITAL_COMMUNITY): Payer: Self-pay

## 2021-06-18 ENCOUNTER — Encounter: Payer: Self-pay | Admitting: Cardiology

## 2021-06-20 ENCOUNTER — Other Ambulatory Visit (HOSPITAL_COMMUNITY): Payer: Self-pay

## 2021-06-20 MED ORDER — AMOXICILLIN-POT CLAVULANATE 875-125 MG PO TABS
1.0000 | ORAL_TABLET | Freq: Two times a day (BID) | ORAL | 0 refills | Status: DC
  Filled 2021-06-20: qty 14, 7d supply, fill #0

## 2021-06-20 MED ORDER — PREDNISONE 10 MG (21) PO TBPK
ORAL_TABLET | ORAL | 0 refills | Status: DC
  Filled 2021-06-20: qty 21, 6d supply, fill #0

## 2021-06-28 ENCOUNTER — Other Ambulatory Visit (HOSPITAL_COMMUNITY): Payer: Self-pay

## 2021-06-28 ENCOUNTER — Encounter: Payer: Self-pay | Admitting: Cardiology

## 2021-06-28 MED ORDER — DILTIAZEM HCL 60 MG PO TABS
60.0000 mg | ORAL_TABLET | Freq: Three times a day (TID) | ORAL | 0 refills | Status: DC
  Filled 2021-06-28: qty 270, 90d supply, fill #0

## 2021-06-28 NOTE — Telephone Encounter (Signed)
Reviewed with Dr Gasper Sells and patient should resume Cardizem at dose he was previously taking

## 2021-07-07 NOTE — Telephone Encounter (Signed)
Informed pt that Dr. Curt Bears reviewed and recommends follow up some time next month. He can only come on a Friday as they drive from [place]. Aware  I will forward to scheduler to arrange him for 3/17 in [place] office, afternoon appt. He asks that scheduler send him date/time via mychart.

## 2021-07-11 ENCOUNTER — Telehealth: Payer: Self-pay | Admitting: Cardiology

## 2021-07-11 NOTE — Telephone Encounter (Signed)
Pt was called and scheduled !

## 2021-07-11 NOTE — Telephone Encounter (Signed)
Patient states that Dr. Curt Bears' office was to supposed to be getting him worked in on 08/04/21 in [place]. He is coming from right outside of [place], [place] and can only do Friday appts.

## 2021-07-11 NOTE — Telephone Encounter (Signed)
Patient lives right outside of [place], [place] and can only do Friday appointments. Dr. Wendy Poet next Friday available in [place] is not until August. He wants to know if this can be done virtually - he tries to align Dr. Curt Bears and Dr. Wendy Poet appts on the same day. He states he has a [employer] and can send a mobile EKG to Dr. Agustin Cree if he approves of virtual.

## 2021-07-11 NOTE — Telephone Encounter (Signed)
Spoke to the patient just now and go him scheduled for a virtual visit with Dr. Agustin Cree. He verbalizes understanding and thanked me for calling him back.    Encouraged patient to call back with any questions or concerns.

## 2021-07-12 ENCOUNTER — Other Ambulatory Visit (HOSPITAL_COMMUNITY): Payer: Self-pay

## 2021-07-12 MED ORDER — TESTOSTERONE CYPIONATE 100 MG/ML IM SOLN
INTRAMUSCULAR | 0 refills | Status: DC
  Filled 2021-07-12: qty 10, 140d supply, fill #0
  Filled 2021-10-24: qty 10, 90d supply, fill #0

## 2021-07-13 ENCOUNTER — Other Ambulatory Visit (HOSPITAL_COMMUNITY): Payer: Self-pay

## 2021-07-19 ENCOUNTER — Other Ambulatory Visit (HOSPITAL_COMMUNITY): Payer: Self-pay

## 2021-07-20 ENCOUNTER — Other Ambulatory Visit (HOSPITAL_COMMUNITY): Payer: Self-pay

## 2021-07-20 MED ORDER — TESTOSTERONE CYPIONATE 200 MG/ML IM SOLN
INTRAMUSCULAR | 0 refills | Status: DC
  Filled 2021-07-20: qty 2, 28d supply, fill #0
  Filled 2021-10-04: qty 2, 28d supply, fill #1

## 2021-07-21 ENCOUNTER — Other Ambulatory Visit (HOSPITAL_COMMUNITY): Payer: Self-pay

## 2021-07-26 ENCOUNTER — Encounter: Payer: Self-pay | Admitting: Cardiology

## 2021-07-26 ENCOUNTER — Other Ambulatory Visit (HOSPITAL_COMMUNITY): Payer: Self-pay

## 2021-07-27 ENCOUNTER — Other Ambulatory Visit (HOSPITAL_COMMUNITY): Payer: Self-pay

## 2021-08-04 ENCOUNTER — Ambulatory Visit: Payer: Medicare Other | Admitting: Cardiology

## 2021-08-04 ENCOUNTER — Other Ambulatory Visit (HOSPITAL_COMMUNITY): Payer: Self-pay

## 2021-08-04 ENCOUNTER — Encounter: Payer: Self-pay | Admitting: Cardiology

## 2021-08-04 ENCOUNTER — Other Ambulatory Visit: Payer: Self-pay

## 2021-08-04 VITALS — BP 124/78 | HR 59 | Ht 68.0 in | Wt 196.6 lb

## 2021-08-04 DIAGNOSIS — I483 Typical atrial flutter: Secondary | ICD-10-CM | POA: Diagnosis not present

## 2021-08-04 MED ORDER — DILTIAZEM HCL ER COATED BEADS 180 MG PO CP24
180.0000 mg | ORAL_CAPSULE | Freq: Every day | ORAL | 1 refills | Status: DC
  Filled 2021-08-04: qty 30, 30d supply, fill #0

## 2021-08-04 NOTE — Patient Instructions (Addendum)
Medication Instructions:  ?Your physician has recommended you make the following change in your medication:  ?STOP Xarelto ?TAKE Diltiazem 180 mg daily if you go back into AFib ? ?*If you need a refill on your cardiac medications before your next appointment, please call your pharmacy* ? ? ?Lab Work: ?None ordered ?If you have labs (blood work) drawn today and your tests are completely normal, you will receive your results only by: ?MyChart Message (if you have MyChart) OR ?A paper copy in the mail ?If you have any lab test that is abnormal or we need to change your treatment, we will call you to review the results. ? ? ?Testing/Procedures: ?None ordered ? ? ?Follow-Up: ?At [facility], you and your health needs are our priority.  As part of our continuing mission to provide you with exceptional heart care, we have created designated Provider Care Teams.  These Care Teams include your primary Cardiologist (physician) and Advanced Practice Providers (APPs -  Physician Assistants and Nurse Practitioners) who all work together to provide you with the care you need, when you need it. ? ?Your next appointment:   ?6 month(s) ? ?The format for your next appointment:   ?In Person ? ?Provider:   ?Allegra Lai, MD ? ? ? ?Thank you for choosing [facility]!! ? ? ?Trinidad Curet, RN ?([phone] ? ? ?Other Instructions ? ?  ?

## 2021-08-04 NOTE — Progress Notes (Signed)
? ?Electrophysiology Office Note ? ? ?Date:  08/04/2021  ? ?ID:  Evan Russell, DOB Jun 06, 1955, MRN [id number] ? ?PCP:  Evan Churn, Evan Russell  ?Cardiologist:  Evan Russell ?Primary Electrophysiologist:  Evan Luebbe Meredith Leeds, Evan Russell   ? ?Chief Complaint: AF ?  ?History of Present Illness: ?Evan Russell is a 66 y.o. male who is being seen today for the evaluation of AF at the request of Evan Russell. Presenting today for electrophysiology evaluation. ? ?He has a history significant for apical variant hypertrophic cardiomyopathy initially diagnosed in 2019.  MRI did not show late gadolinium and he did not have an apical aneurysm.  He has a history of atrial flutter.  He has a CVA and is on Xarelto.  He presented to his primary cardiologist office and was noted to be in atrial flutter.  He is status post ablation 05/08/2021.  He continued to have palpitations. ? ?Today, denies symptoms of palpitations, chest pain, shortness of breath, orthopnea, PND, lower extremity edema, claudication, dizziness, presyncope, syncope, bleeding, or neurologic sequela. The patient is tolerating medications without difficulties.  Has been feeling well.  He has not noted any further episodes of atrial flutter.  He did send a cardia mobile episode with atrial flutter, though the rate was slower than his prior typical atrial flutter.  He does have PVCs and notes irregularity in his heartbeat, but this occurs mainly when he is stressed. ? ? ?Past Medical History:  ?Diagnosis Date  ? Abnormal EKG 01/14/2018  ? Androgen deficiency   ? Chronic anticoagulation 10/24/2018  ? Dyslipidemia 01/05/2020  ? Elevated cholesterol   ? Hx of hypertrophic cardiomyopathy 09/15/2018  ? Minimal thickening of the apex with abnormal EKG based on the MRI  ? Paroxysmal atrial flutter (Evan Russell) 01/05/2020  ? Tinnitus   ? ?Past Surgical History:  ?Procedure Laterality Date  ? A-FLUTTER ABLATION N/A 05/08/2021  ? Procedure: A-FLUTTER ABLATION;  Surgeon: Evan Haw, Evan Russell;   Location: [facility];  Service: Cardiovascular;  Laterality: N/A;  ? CARDIOVERSION    ? CARDIOVERSION N/A 03/24/2021  ? Procedure: CARDIOVERSION;  Surgeon: Evan Latch, Evan Russell;  Location: [facility];  Service: Cardiovascular;  Laterality: N/A;  ? ? ? ?Current Outpatient Medications  ?Medication Sig Dispense Refill  ? atorvastatin (LIPITOR) 10 MG tablet TAKE 1 TABLET BY MOUTH ONCE DAILY (Patient taking differently: Take 10 mg by mouth at bedtime.) 90 tablet 3  ? B Complex-Evan Russell-Folic Acid (SUPER B COMPLEX/FA/VIT Evan Russell) TABS Take 1 tablet by mouth daily.    ? diltiazem (CARDIZEM CD) 180 MG 24 hr capsule Take 1 capsule (180 mg total) by mouth daily. 30 capsule 1  ? fexofenadine (ALLEGRA) 180 MG tablet Take 180 mg by mouth as needed for allergies or rhinitis.    ? Methylcobalamin (B-12) 5000 MCG TBDP Take 5,000 mcg by mouth daily.    ? Multiple Vitamin (MULTIVITAMIN) tablet Take 1 tablet by mouth daily.    ? nitroGLYCERIN (NITROSTAT) 0.4 MG SL tablet Dissolve 1 tablet (0.4 mg total) under the tongue every 5 (five) minutes as needed for chest pain (Patient taking differently: Place 0.4 mg under the tongue every 5 (five) minutes as needed for chest pain.) 25 tablet 0  ? Psyllium (METAMUCIL FIBER PO) Take 6 capsules by mouth daily.    ? testosterone cypionate (DEPOTESTOSTERONE CYPIONATE) 200 MG/ML injection Inject 0.5 mLs (100 mg total) into the muscle every 14 (fourteen) days. 5 mL 0  ? testosterone cypionate (DEPOTESTOTERONE CYPIONATE) 100 MG/ML injection  Inject 1 mL (100 mg total) into the muscle every 14 (fourteen) days. 10 mL 0  ? triamcinolone (NASACORT) 55 MCG/ACT AERO nasal inhaler Place 1 spray into the nose daily as needed (Sinus/ allergies).    ? Vitamin D, Cholecalciferol, 50 MCG (2000 UT) CAPS Take 2,000 Units by mouth daily.    ? ?No current facility-administered medications for this visit.  ? ? ?Allergies:   Patient has no known allergies.  ? ?Social History:  The patient  reports that he has never  smoked. He has never used smokeless tobacco. He reports current alcohol use of about 2.0 standard drinks per week.  ? ?Family History:  The patient's family history includes Atrial fibrillation in his mother; CAD in his father; Heart failure in his mother; Hyperlipidemia in his father; Idiopathic pulmonary fibrosis in his sister; Multiple myeloma in his father; Narcolepsy in his mother.  ? ?ROS:  Please see the history of present illness.   Otherwise, review of systems is positive for none.   All other systems are reviewed and negative.  ? ?PHYSICAL EXAM: ?VS:  BP 124/78   Pulse (!) 59   Ht '5\' 8"'  (1.727 m)   Wt 196 lb 9.6 oz (89.2 kg)   SpO2 96%   BMI 29.89 kg/m?  , BMI Body mass index is 29.89 kg/m?. ?GEN: Well nourished, well developed, in no acute distress  ?HEENT: normal  ?Neck: no JVD, carotid bruits, or masses ?Cardiac: RRR; no murmurs, rubs, or gallops,no edema  ?Respiratory:  clear to auscultation bilaterally, normal work of breathing ?GI: soft, nontender, nondistended, + BS ?MS: no deformity or atrophy  ?Skin: warm and dry ?Neuro:  Strength and sensation are intact ?Psych: euthymic mood, full affect ? ?EKG:  EKG is ordered today. ?Personal review of the ekg ordered shows sinus rhythm, LVH, T wave inversions ? ?Recent Labs: ?04/12/2021: BUN 20; Creatinine, Ser 1.05; Hemoglobin 16.7; Platelets 191; Potassium 4.1; Sodium 139  ? ? ?Lipid Panel  ?No results found for: CHOL, TRIG, HDL, CHOLHDL, VLDL, LDLCALC, LDLDIRECT ? ? ?Wt Readings from Last 3 Encounters:  ?08/04/21 196 lb 9.6 oz (89.2 kg)  ?06/09/21 197 lb 12.8 oz (89.7 kg)  ?05/08/21 188 lb (85.3 kg)  ?  ? ? ?Other studies Reviewed: ?Additional studies/ records that were reviewed today include: TTE 01/29/20  ?Review of the above records today demonstrates:  ? 1. Left ventricular ejection fraction, by estimation, is 60 to 65%. The  ?left ventricle has normal function. The left ventricle has no regional  ?wall motion abnormalities. There is mild  asymmetric left ventricular  ?hypertrophy of the posterior segment.  ?Left ventricular diastolic parameters are consistent with Grade II  ?diastolic dysfunction (pseudonormalization).  ? 2. Right ventricular systolic function is normal. The right ventricular  ?size is normal.  ? 3. The mitral valve is normal in structure. No evidence of mitral valve  ?regurgitation. No evidence of mitral stenosis.  ? 4. The aortic valve is normal in structure. Aortic valve regurgitation is  ?not visualized. No aortic stenosis is present.  ? 5. The inferior vena cava is normal in size with greater than 50%  ?respiratory variability, suggesting right atrial pressure of 3 mmHg. ? ? ?ASSESSMENT AND PLAN: ? ?1.  Typical atrial flutter: CHA2DS2-VASc of 2.  Status post ablation 05/08/2021.  Currently on Xarelto 20 mg daily.  As he is having minimal episodes of atrial flutter, we Evan Russell plan to stop his Xarelto.  He has a cardia mobile and Evan Russell monitor  his rhythm on a daily basis and if he goes back into atrial flutter he Evan Russell restart his Xarelto.  He is going on a trip to [place] upcoming.  We Evan Russell give him diltiazem to take as needed.   ? ?2.  Hypertrophic cardiomyopathy: Apical variant.  Has minimal LGE and no apical aneurysm.  We Evan Russell continue with plans per primary cardiology. ? ?Current medicines are reviewed at length with the patient today.   ?The patient does not have concerns regarding his medicines.  The following changes were made today: Stop Xarelto, start diltiazem ? ?Labs/ tests ordered today include:  ?Orders Placed This Encounter  ?Procedures  ? EKG 12-Lead  ? ? ? ? ?Disposition:   FU with Cintya Daughety 6 months ? ?Signed, ?Evan Eugene Meredith Leeds, Evan Russell  ?08/04/2021 4:39 PM    ? ?[facility] ?[address] ?[address] ?[place] [address] ?[phone] (office) ?[phone] (fax) ? ?

## 2021-08-18 ENCOUNTER — Encounter: Payer: Self-pay | Admitting: Cardiology

## 2021-08-22 ENCOUNTER — Encounter: Payer: Self-pay | Admitting: Cardiology

## 2021-08-22 ENCOUNTER — Ambulatory Visit (INDEPENDENT_AMBULATORY_CARE_PROVIDER_SITE_OTHER): Payer: Medicare Other | Admitting: Cardiology

## 2021-08-22 ENCOUNTER — Other Ambulatory Visit (HOSPITAL_COMMUNITY): Payer: Self-pay

## 2021-08-22 VITALS — BP 131/78 | HR 61 | Ht 68.0 in | Wt 192.1 lb

## 2021-08-22 DIAGNOSIS — E785 Hyperlipidemia, unspecified: Secondary | ICD-10-CM | POA: Diagnosis not present

## 2021-08-22 DIAGNOSIS — I4819 Other persistent atrial fibrillation: Secondary | ICD-10-CM

## 2021-08-22 DIAGNOSIS — I4892 Unspecified atrial flutter: Secondary | ICD-10-CM | POA: Diagnosis not present

## 2021-08-22 MED ORDER — FUROSEMIDE 20 MG PO TABS
20.0000 mg | ORAL_TABLET | Freq: Every day | ORAL | 3 refills | Status: DC
  Filled 2021-08-22: qty 90, 90d supply, fill #0

## 2021-08-22 NOTE — Patient Instructions (Signed)
Medication Instructions:  ?Your physician has recommended you make the following change in your medication:  ? ?START: Lasix 20 mg daily ? ?*If you need a refill on your cardiac medications before your next appointment, please call your pharmacy* ? ? ?Lab Work: ?None ?If you have labs (blood work) drawn today and your tests are completely normal, you will receive your results only by: ?MyChart Message (if you have MyChart) OR ?A paper copy in the mail ?If you have any lab test that is abnormal or we need to change your treatment, we will call you to review the results. ? ? ?Testing/Procedures: ?None ? ? ?Follow-Up: ?At [facility], you and your health needs are our priority.  As part of our continuing mission to provide you with exceptional heart care, we have created designated Provider Care Teams.  These Care Teams include your primary Cardiologist (physician) and Advanced Practice Providers (APPs -  Physician Assistants and Nurse Practitioners) who all work together to provide you with the care you need, when you need it. ? ?We recommend signing up for the patient portal called "MyChart".  Sign up information is provided on this After Visit Summary.  MyChart is used to connect with patients for Virtual Visits (Telemedicine).  Patients are able to view lab/test results, encounter notes, upcoming appointments, etc.  Non-urgent messages can be sent to your provider as well.   ?To learn more about what you can do with MyChart, go to [email or web address].   ? ?Your next appointment:   ?6 month(s) ? ?The format for your next appointment:   ?In Person ? ?Provider:   ?Jenne Campus, MD  ? ? ?Other Instructions ?None ? ?

## 2021-08-22 NOTE — Progress Notes (Signed)
? ?Virtual Visit via Telephone Note  ? ?This visit type was conducted due to national recommendations for restrictions regarding the COVID-19 Pandemic (e.g. social distancing) in an effort to limit this patient's exposure and mitigate transmission in our community.  Due to his co-morbid illnesses, this patient is at least at moderate risk for complications without adequate follow up.  This format is felt to be most appropriate for this patient at this time.  The patient did not have access to video technology/had technical difficulties with video requiring transitioning to audio format only (telephone).  All issues noted in this document were discussed and addressed.  No physical exam could be performed with this format.  Please refer to the patient's chart for his  consent to telehealth for [facility].  ?Evaluation Performed:  Follow-up visit ? ?This visit type was conducted due to national recommendations for restrictions regarding the COVID-19 Pandemic (e.g. social distancing).  This format is felt to be most appropriate for this patient at this time.  All issues noted in this document were discussed and addressed.  No physical exam was performed (except for noted visual exam findings with Video Visits).  Please refer to the patient's chart (MyChart message for video visits and phone note for telephone visits) for the patient's consent to telehealth for [facility]. ? ?Date:  08/22/2021 ? ?ID: Evan Russell, DOB 09/24/1955, MRN [id number]  ? ?Patient Location: ?[place] ?Evan [place] [id number]  ? ?Provider location:   [place] Office ? ?PCP:  [name], PA-C  ?Cardiologist:  Jenne Campus, MD   ? ? ?Chief Complaint: I had episode of atrial fibrillation  ? ?History of Present Illness:   ? ?Evan Russell is a 66 y.o. male  who presents via audio/video conferencing for a telehealth visit today.  With past medical history significant for apical variant hypertrophic cardiomyopathy,  paroxysmal atrial flutter, status post ablation done in December, paroxysmal atrial fibrillation.  Dyslipidemia.  He does have virtual visit with me today, we are unable to establish video link therefore we do have only conversation over the phone.  Overall he is doing very well but a few weeks ago he had episode of atrial fibrillation.  He does have a device that allowed him to record his EKG and it showed atrial fibrillation.  He started taking Cardizem CD however did not start taking the Xarelto.  Since that time he is doing well and the episode lasted less than 48 hours.  He still goes to gym on the regular basis he is planning to go to [place] to enjoy his holiday.  And overall seems to be doing well. ? ? ?The patient does not have symptoms concerning for COVID-19 infection (fever, chills, cough, or new SHORTNESS OF BREATH).  ? ? ?Prior CV studies:   ?The following studies were reviewed today: ? ? ? ? ? ? ?Past Medical History:  ?Diagnosis Date  ? Abnormal EKG 01/14/2018  ? Androgen deficiency   ? Chronic anticoagulation 10/24/2018  ? Dyslipidemia 01/05/2020  ? Elevated cholesterol   ? Hx of hypertrophic cardiomyopathy 09/15/2018  ? Minimal thickening of the apex with abnormal EKG based on the MRI  ? Paroxysmal atrial flutter ([place]) 01/05/2020  ? Tinnitus   ? ? ?Past Surgical History:  ?Procedure Laterality Date  ? A-FLUTTER ABLATION N/A 05/08/2021  ? Procedure: A-FLUTTER ABLATION;  Surgeon: Constance Haw, MD;  Location: [facility];  Service: Cardiovascular;  Laterality: N/A;  ?  CARDIOVERSION    ? CARDIOVERSION N/A 03/24/2021  ? Procedure: CARDIOVERSION;  Surgeon: Skeet Latch, MD;  Location: [facility];  Service: Cardiovascular;  Laterality: N/A;  ? ? ? ?Current Meds  ?Medication Sig  ? atorvastatin (LIPITOR) 10 MG tablet TAKE 1 TABLET BY MOUTH ONCE DAILY (Patient taking differently: Take 10 mg by mouth at bedtime.)  ? B Complex-C-Folic Acid (SUPER B COMPLEX/FA/VIT C) TABS Take 1 tablet by mouth  daily.  ? fexofenadine (ALLEGRA) 180 MG tablet Take 180 mg by mouth as needed for allergies or rhinitis.  ? Methylcobalamin (B-12) 5000 MCG TBDP Take 5,000 mcg by mouth daily.  ? Multiple Vitamin (MULTIVITAMIN) tablet Take 1 tablet by mouth daily.  ? nitroGLYCERIN (NITROSTAT) 0.4 MG SL tablet Dissolve 1 tablet (0.4 mg total) under the tongue every 5 (five) minutes as needed for chest pain (Patient taking differently: Place 0.4 mg under the tongue every 5 (five) minutes as needed for chest pain.)  ? Psyllium (METAMUCIL FIBER PO) Take 6 capsules by mouth daily.  ? testosterone cypionate (DEPOTESTOSTERONE CYPIONATE) 200 MG/ML injection Inject 0.5 mLs (100 mg total) into the muscle every 14 (fourteen) days.  ? testosterone cypionate (DEPOTESTOTERONE CYPIONATE) 100 MG/ML injection Inject 1 mL (100 mg total) into the muscle every 14 (fourteen) days.  ? triamcinolone (NASACORT) 55 MCG/ACT AERO nasal inhaler Place 1 spray into the nose daily as needed (Sinus/ allergies).  ? Vitamin D, Cholecalciferol, 50 MCG (2000 UT) CAPS Take 2,000 Units by mouth daily.  ?  ? ? ?Family History: ?The patient's family history includes Atrial fibrillation in his mother; CAD in his father; Heart failure in his mother; Hyperlipidemia in his father; Idiopathic pulmonary fibrosis in his sister; Multiple myeloma in his father; Narcolepsy in his mother. ? ? ?ROS:   ?Please see the history of present illness.    ? ?All other systems reviewed and are negative. ? ? ?Labs/Other Tests and Data Reviewed:   ? ? ?Recent Labs: ?04/12/2021: BUN 20; Creatinine, Ser 1.05; Hemoglobin 16.7; Platelets 191; Potassium 4.1; Sodium 139  ?Recent Lipid Panel ?No results found for: CHOL, TRIG, HDL, CHOLHDL, VLDL, LDLCALC, LDLDIRECT ? ? ? ?Exam:   ? ?Vital Signs:  BP 131/78 (Patient Position: Sitting)   Pulse 61   Ht _0  (1.727 m)   Wt 192 lb 1.6 oz (87.1 kg)   SpO2 97%   BMI 29.21 kg/m?    ? ?Wt Readings from Last 3 Encounters:  ?08/22/21 192 lb 1.6 oz (87.1 kg)   ?08/04/21 196 lb 9.6 oz (89.2 kg)  ?06/09/21 197 lb 12.8 oz (89.7 kg)  ?  ? ?Well nourished, well developed in no acute distress. ?Alert awake and x3 not in any distress cheerful talking to me over the phone ? ?Diagnosis for this visit:  ? ?1. Paroxysmal atrial flutter ([place])   ?2. Persistent atrial fibrillation ([place])   ?3. Dyslipidemia   ? ? ? ?ASSESSMENT & PLAN:   ? ?1.  Paroxysmal atrial flutter, status post atrial flutter ablation.  But he described to have 1 episode of atrial fibrillation.  Ask him to send me copy of the recording from his cardia device to document his atrial fibrillation.  He does have Cardizem CD as well as regular Cardizem and he started taking it the day he was having atrial fibrillation.  I also stressed importance of taking his Xarelto on the regular basis.  He does not want to take it continuously check his EKG 3 times a day and he  promised me if he will see episode of atrial fibrillation he start taking Xarelto.  Also asking to start taking Cardizem when it happens.  If he will have more frequent episode of atrial fibrillation then we need to talk about permanently keeping him on anticoagulation since he does have hypertrophic cardiomyopathy. ?Dyslipidemia I do not have any recent fasting lipid profile make arrangements to have the test done after he came back from [place]. ?Apical variant of hypertrophic cardiomyopathy.  Stable denies have any dizziness passing out.  Still exercise on the regular basis and have no difficulties doing that. ? ?COVID-19 Education: ?The signs and symptoms of COVID-19 were discussed with the patient and how to seek care for testing (follow up with PCP or arrange E-visit).  The importance of social distancing was discussed today. ? ?Patient Risk:   ?After full review of this patients clinical status, I feel that they are at least moderate risk at this time. ? ?Time:   ?Today, I have spent 15 minutes with the patient with telehealth technology discussing pt  health issues.  I spent 20 minutes reviewing her chart before the visit.  Visit was finished at 914. ? ? ? ?Medication Adjustments/Labs and Tests Ordered: ?Current medicines are reviewed at length with the patient

## 2021-09-25 ENCOUNTER — Other Ambulatory Visit (HOSPITAL_COMMUNITY): Payer: Self-pay

## 2021-10-04 ENCOUNTER — Other Ambulatory Visit (HOSPITAL_COMMUNITY): Payer: Self-pay

## 2021-10-09 ENCOUNTER — Encounter: Payer: Self-pay | Admitting: Cardiology

## 2021-10-10 ENCOUNTER — Telehealth: Payer: Self-pay

## 2021-10-10 ENCOUNTER — Other Ambulatory Visit (HOSPITAL_COMMUNITY): Payer: Self-pay

## 2021-10-10 ENCOUNTER — Other Ambulatory Visit: Payer: Self-pay

## 2021-10-10 MED ORDER — RIVAROXABAN 20 MG PO TABS
20.0000 mg | ORAL_TABLET | Freq: Every day | ORAL | 6 refills | Status: DC
  Filled 2021-10-10: qty 30, 30d supply, fill #0
  Filled 2021-12-06: qty 30, 30d supply, fill #1
  Filled 2022-01-05 – 2022-01-16 (×2): qty 30, 30d supply, fill #2
  Filled 2022-02-20: qty 30, 30d supply, fill #3
  Filled 2022-03-22: qty 30, 30d supply, fill #4
  Filled 2022-04-19: qty 30, 30d supply, fill #5
  Filled 2022-05-26: qty 30, 30d supply, fill #6

## 2021-10-10 MED ORDER — APIXABAN 5 MG PO TABS
5.0000 mg | ORAL_TABLET | Freq: Two times a day (BID) | ORAL | 3 refills | Status: DC
Start: 2021-10-10 — End: 2021-10-10
  Filled 2021-10-10: qty 60, 30d supply, fill #0

## 2021-10-10 MED ORDER — DILTIAZEM HCL ER 60 MG PO CP12
60.0000 mg | ORAL_CAPSULE | Freq: Three times a day (TID) | ORAL | 6 refills | Status: DC
  Filled 2021-10-10: qty 180, 60d supply, fill #0
  Filled 2021-12-06: qty 180, 60d supply, fill #1

## 2021-10-10 NOTE — Telephone Encounter (Signed)
Spoke with pt about his A fib- Dr. Agustin Cree recommended he continue with Xarelto '20mg'$  q d and Diltiazem '60mg'$  tid. The pt is agreeable and will call with any problems or concerns. The Eliquis that was sent to the pharmacy was cancelled as pt is on Xarelto- per Dr. Agustin Cree. Refiils for Xarelto and Diltiazem sent to pharmacy per Dr. Agustin Cree.

## 2021-10-10 NOTE — Telephone Encounter (Signed)
Eliquis '5mg'$  #60 ref x3 sent to pharmacy per chat in My chart- per Dr Wendy Poet note.

## 2021-10-11 ENCOUNTER — Other Ambulatory Visit (HOSPITAL_COMMUNITY): Payer: Self-pay

## 2021-10-12 ENCOUNTER — Other Ambulatory Visit (HOSPITAL_COMMUNITY): Payer: Self-pay

## 2021-10-18 NOTE — Telephone Encounter (Signed)
Pt reports that "all has resolved a week ago today", no further episodes. He did have a Martini on Sunday evening before episode/s occurred. He is going to the gym and is "not hampered" during work outs.  Does not feel like he is not having afib/PVCs during workouts. When episodes do occur it is in the morning or evening time. He would like to make sure Dr. Curt Bears did review the report hx that he attached showing the possible PVC activity.  Aware I will confirm with the MD. Will discuss if another monitor is needed, but will most likely continue monitoring d/t recent 2 wk monitor worn in February. States he also read an article recently talking about the possible correlation between antihistamines & PVCs.  Reports that he usually takes an Allegra every night, but he held it last night and experienced no PVCs thus far today.  Aware I will discuss this as well. Patient verbalized understanding and agreeable to plan.

## 2021-10-24 ENCOUNTER — Other Ambulatory Visit (HOSPITAL_COMMUNITY): Payer: Self-pay

## 2021-10-25 ENCOUNTER — Other Ambulatory Visit (HOSPITAL_COMMUNITY): Payer: Self-pay

## 2021-11-08 ENCOUNTER — Encounter: Payer: Self-pay | Admitting: Cardiology

## 2021-11-08 ENCOUNTER — Ambulatory Visit (INDEPENDENT_AMBULATORY_CARE_PROVIDER_SITE_OTHER): Payer: Medicare Other

## 2021-11-08 ENCOUNTER — Other Ambulatory Visit: Payer: Self-pay | Admitting: *Deleted

## 2021-11-08 DIAGNOSIS — I4892 Unspecified atrial flutter: Secondary | ICD-10-CM

## 2021-11-08 DIAGNOSIS — R002 Palpitations: Secondary | ICD-10-CM

## 2021-11-10 ENCOUNTER — Ambulatory Visit: Payer: Medicare Other

## 2021-11-10 DIAGNOSIS — I4892 Unspecified atrial flutter: Secondary | ICD-10-CM

## 2021-11-14 DIAGNOSIS — I4892 Unspecified atrial flutter: Secondary | ICD-10-CM | POA: Diagnosis not present

## 2021-11-14 DIAGNOSIS — R002 Palpitations: Secondary | ICD-10-CM | POA: Diagnosis not present

## 2021-11-23 ENCOUNTER — Telehealth: Payer: Self-pay | Admitting: *Deleted

## 2021-11-23 NOTE — Telephone Encounter (Signed)
Spoke with pt about zio that was ordered but sent back with no data. Pt is wearing a preventice that was ordered by [facility] so did not wear the zio, sent it back without wearing it. Made an appt for video visit in Oct. While pt was on the phone.

## 2021-12-06 ENCOUNTER — Other Ambulatory Visit (HOSPITAL_COMMUNITY): Payer: Self-pay

## 2021-12-07 ENCOUNTER — Other Ambulatory Visit (HOSPITAL_COMMUNITY): Payer: Self-pay

## 2021-12-08 ENCOUNTER — Other Ambulatory Visit (HOSPITAL_COMMUNITY): Payer: Self-pay

## 2022-01-01 ENCOUNTER — Other Ambulatory Visit (HOSPITAL_COMMUNITY): Payer: Self-pay

## 2022-01-01 ENCOUNTER — Other Ambulatory Visit: Payer: Self-pay | Admitting: Cardiology

## 2022-01-01 MED ORDER — ATORVASTATIN CALCIUM 10 MG PO TABS
ORAL_TABLET | Freq: Every day | ORAL | 3 refills | Status: DC
  Filled 2022-01-01: qty 90, 90d supply, fill #0
  Filled 2022-01-05 – 2022-04-19 (×3): qty 90, 90d supply, fill #1
  Filled 2022-05-26 – 2022-07-24 (×2): qty 90, 90d supply, fill #2
  Filled 2022-10-18: qty 90, 90d supply, fill #3

## 2022-01-05 ENCOUNTER — Encounter: Payer: Self-pay | Admitting: Cardiology

## 2022-01-05 ENCOUNTER — Other Ambulatory Visit (HOSPITAL_COMMUNITY): Payer: Self-pay

## 2022-01-09 ENCOUNTER — Encounter: Payer: Self-pay | Admitting: Cardiology

## 2022-01-10 NOTE — Telephone Encounter (Addendum)
Pt reports he has been in AFib for 8 days now.  He does add that he is still doing his normal ADLs, including working out at [employer]. Pt is not interested in ablation at this time. Pt might consider anti-arrhythmic depending on what MD recommends. He also would like to know if he can increase his Diltiazem.  He is currently taking 60 mg TID and would like to continue taking it TID.  Aware if MD agreeable then he could take 120 mg one time and 60 mg the remaining 2 times. Pt aware I will follow up once advised by MD, pt agreeable to plan.  (Pt scheduled for routine follow up with Dr. Curt Bears on 10/6)

## 2022-01-12 ENCOUNTER — Telehealth: Payer: Self-pay | Admitting: Cardiology

## 2022-01-12 MED ORDER — DILTIAZEM HCL ER 60 MG PO CP12: ORAL_CAPSULE | ORAL | 6 refills | Status: DC

## 2022-01-12 NOTE — Telephone Encounter (Signed)
Pt c/o medication issue:  1. Name of Medication:  diltiazem (CARDIZEM SR) 60 MG 12 hr capsule  2. How are you currently taking this medication (dosage and times per day)? As prescribed  3. Are you having a reaction (difficulty breathing--STAT)?   No  4. What is your medication issue?   Patient stated he is following-up on this medication change to increase the dosage on this medication.  Patient stated he is on day 10 of an event.

## 2022-01-12 NOTE — Telephone Encounter (Signed)
Pt aware ok to take Diltiazem TID.  Take 120 mg one dose and 60 mg the other 2 doses as discussed. Patient verbalized understanding and agreeable to plan.  Will not send in Rx per pt, he will let us know when he would like it sent.

## 2022-01-16 ENCOUNTER — Other Ambulatory Visit (HOSPITAL_COMMUNITY): Payer: Self-pay

## 2022-01-17 ENCOUNTER — Other Ambulatory Visit (HOSPITAL_COMMUNITY): Payer: Self-pay

## 2022-01-23 ENCOUNTER — Encounter: Payer: Self-pay | Admitting: Cardiology

## 2022-01-26 ENCOUNTER — Ambulatory Visit: Payer: Medicare Other | Admitting: Cardiology

## 2022-01-30 ENCOUNTER — Encounter (HOSPITAL_COMMUNITY): Payer: Self-pay

## 2022-01-30 ENCOUNTER — Ambulatory Visit (HOSPITAL_COMMUNITY)
Admission: RE | Admit: 2022-01-30 | Discharge: 2022-01-30 | Disposition: A | Discharge status: Home / Self Care | Payer: Medicare Other | Source: Ambulatory Visit | Attending: Physician Assistant | Admitting: Physician Assistant

## 2022-01-30 ENCOUNTER — Encounter (HOSPITAL_COMMUNITY): Payer: Self-pay | Admitting: Physician Assistant

## 2022-01-30 VITALS — BP 116/74 | HR 85 | Ht 68.0 in | Wt 183.3 lb

## 2022-01-30 DIAGNOSIS — I4819 Other persistent atrial fibrillation: Secondary | ICD-10-CM | POA: Diagnosis not present

## 2022-01-30 NOTE — Progress Notes (Signed)
Electrophysiology TeleHealth Note   Audio/video telehealth visit is felt to be most appropriate for this patient at this time.  See consent below from today for patient consent regarding telehealth for the [facility].    Date:  01/30/2022   ID:  Evan Russell, DOB Feb 02, 1956, MRN [id number]  Location: home  Provider location: [address] [place], [place] [address] Evaluation Performed: Follow up  PCP:  [name], PA-C  Primary Cardiologist:  Dr Agustin Cree Primary Electrophysiologist: Dr Curt Bears  Referring MD: Dr Curt Bears   History of Present Illness: Evan Russell is a 66 y.o. male who presents via audio/video conferencing for a telehealth visit today. He has a h/o atrial flutter and HCM. Patient reports that on 01/02/22 his heart went out of rhythm. He states that he had recently started prednisone and methocarbamol for lower back pain. He has remained out of rhythm since, well rate controlled by his Kardia mobile. He does admit to snoring.   Today, he denies symptoms of palpitations, chest pain, shortness of breath, orthopnea, PND, lower extremity edema, claudication, dizziness, presyncope, syncope, bleeding, or neurologic sequela. The patient is tolerating medications without difficulties and is otherwise without complaint today.    Atrial Fibrillation Risk Factors:  he does have symptoms or diagnosis of sleep apnea. he is agreeable to sleep study. he does not have a history of rheumatic fever. he does have a history of alcohol use. The patient does have a history of early familial atrial fibrillation or other arrhythmias. Mother and father had afib.  he has a BMI of Body mass index is 27.87 kg/m.Marland Kitchen Filed Weights   01/30/22 1410  Weight: 83.1 kg    Past Medical History:  Diagnosis Date   Abnormal EKG 01/14/2018   Androgen deficiency    Chronic anticoagulation 10/24/2018   Dyslipidemia 01/05/2020   Elevated cholesterol    Hx of hypertrophic  cardiomyopathy 09/15/2018   Minimal thickening of the apex with abnormal EKG based on the MRI   Paroxysmal atrial flutter ([place]) 01/05/2020   Tinnitus    Past Surgical History:  Procedure Laterality Date   A-FLUTTER ABLATION N/A 05/08/2021   Procedure: A-FLUTTER ABLATION;  Surgeon: Constance Haw, MD;  Location: [facility];  Service: Cardiovascular;  Laterality: N/A;   CARDIOVERSION     CARDIOVERSION N/A 03/24/2021   Procedure: CARDIOVERSION;  Surgeon: Skeet Latch, MD;  Location: [facility] ENDOSCOPY;  Service: Cardiovascular;  Laterality: N/A;     Current Outpatient Medications  Medication Sig Dispense Refill   atorvastatin (LIPITOR) 10 MG tablet TAKE 1 TABLET BY MOUTH ONCE DAILY 90 tablet 3   B Complex-C-Folic Acid (SUPER B COMPLEX/FA/VIT C) TABS Take 1 tablet by mouth daily.     diltiazem (CARDIZEM SR) 60 MG 12 hr capsule Take 120 mg one dose, 60 mg the other 2 doses each day 120 capsule 6   fexofenadine (ALLEGRA) 180 MG tablet Take 180 mg by mouth as needed for allergies or rhinitis.     furosemide (LASIX) 20 MG tablet Take 1 tablet by mouth daily. 90 tablet 3   Methylcobalamin (B-12) 5000 MCG TBDP Take 5,000 mcg by mouth daily.     Multiple Vitamin (MULTIVITAMIN) tablet Take 1 tablet by mouth daily.     nitroGLYCERIN (NITROSTAT) 0.4 MG SL tablet Dissolve 1 tablet (0.4 mg total) under the tongue every 5 (five) minutes as needed for chest pain 25 tablet 0   Psyllium (METAMUCIL FIBER PO) Take 6 capsules  by mouth daily.     rivaroxaban (XARELTO) 20 MG TABS tablet Take 1 tablet (20 mg total) by mouth daily with supper. 30 tablet 6   testosterone cypionate (DEPOTESTOSTERONE CYPIONATE) 200 MG/ML injection Inject 0.5 mLs (100 mg total) into the muscle every 14 (fourteen) days. 5 mL 0   testosterone cypionate (DEPOTESTOTERONE CYPIONATE) 100 MG/ML injection Inject 1 mL (100 mg total) into the muscle every 14 (fourteen) days. 10 mL 0   triamcinolone (NASACORT) 55 MCG/ACT AERO nasal  inhaler Place 1 spray into the nose daily as needed (Sinus/ allergies).     Vitamin D, Cholecalciferol, 50 MCG (2000 UT) CAPS Take 2,000 Units by mouth daily.     No current facility-administered medications for this encounter.    Allergies:   Patient has no known allergies.   Social History:  The patient  reports that he has never smoked. He has never used smokeless tobacco. He reports current alcohol use of about 13.0 - 14.0 standard drinks of alcohol per week. He reports that he does not use drugs.   Family History:  The patient's  family history includes Atrial fibrillation in his mother; CAD in his father; Heart failure in his mother; Hyperlipidemia in his father; Idiopathic pulmonary fibrosis in his sister; Multiple myeloma in his father; Narcolepsy in his mother.    ROS:  Please see the history of present illness.   All other systems are personally reviewed and negative.   Exam: Well appearing, alert and conversant, regular work of breathing,  good skin color  Recent Labs: 04/12/2021: BUN 20; Creatinine, Ser 1.05; Hemoglobin 16.7; Platelets 191; Potassium 4.1; Sodium 139  personally reviewed     Kardia mobile strip from today demonstrates Afib HR 85 bpm   CHA2DS2-VASc Score = 1  The patient's score is based upon: CHF History: 0 HTN History: 0 Diabetes History: 0 Stroke History: 0 Vascular Disease History: 0 Age Score: 1 Gender Score: 0       ASSESSMENT AND PLAN: 1. Persistent Atrial Fibrillation/atrial flutter The patient's CHA2DS2-VASc score is 1, indicating a 0.6% annual risk of stroke.   S/p flutter ablation 05/08/21 We discussed rhythm control options today. He is going to be out of the country until 9/24. Will plan for DCCV on his return. We discussed amiodarone as a bridge to ablation. He would like to avoid medications if possible. Has appointment with Dr Curt Bears 10/6 to discuss ablation. Continue diltiazem 180 mg AM and 60 mg PM (patient has used this  dosing previously when out of town) Continue Xarelto 20 mg daily. Anticoagulation is indicated with h/o HCM.  2. HCM Apical variant Followed by Dr Agustin Cree.   Follow-up with Dr Curt Bears as scheduled.   Current medicines are reviewed at length with the patient today.   The patient does not have concerns regarding his medicines.  The following changes were made today:  none  Labs/ tests ordered today include: bmet/cbc prior to DCCV   Patient Risk:  after full review of this patients clinical status, I feel that they are at moderate risk at this time.   Today, I have spent 38 minutes with the patient with telehealth technology discussing the above.    Gwenlyn Perking PA-C 01/30/2022 4:31 PM  [facility] [address] [place], [place] [address] [phone]   I hereby voluntarily request, consent and authorize the [facility] and its employed or contracted physicians, physician assistants, nurse practitioners or other licensed health care professionals (the  Practitioner), to provide me with telemedicine health care services (the "Services") as deemed necessary by the treating Practitioner. I acknowledge and consent to receive the Services by the Practitioner via telemedicine. I understand that the telemedicine visit will involve communicating with the Practitioner through live audiovisual communication technology and the disclosure of certain medical information by electronic transmission. I acknowledge that I have been given the opportunity to request an in-person assessment or other available alternative prior to the telemedicine visit and am voluntarily participating in the telemedicine visit.   I understand that I have the right to withhold or withdraw my consent to the use of telemedicine in the course of my care at any time, without affecting my right to future care or treatment, and that the Practitioner or I may terminate the  telemedicine visit at any time. I understand that I have the right to inspect all information obtained and/or recorded in the course of the telemedicine visit and may receive copies of available information for a reasonable fee.  I understand that some of the potential risks of receiving the Services via telemedicine include:   Delay or interruption in medical evaluation due to technological equipment failure or disruption;  Information transmitted may not be sufficient (e.g. poor resolution of images) to allow for appropriate medical decision making by the Practitioner; and/or  In rare instances, security protocols could fail, causing a breach of personal health information.   Furthermore, I acknowledge that it is my responsibility to provide information about my medical history, conditions and care that is complete and accurate to the best of my ability. I acknowledge that Practitioner's advice, recommendations, and/or decision may be based on factors not within their control, such as incomplete or inaccurate data provided by me or distortions of diagnostic images or specimens that may result from electronic transmissions. I understand that the practice of medicine is not an exact science and that Practitioner makes no warranties or guarantees regarding treatment outcomes. I acknowledge that I will receive a copy of this consent concurrently upon execution via email to the email address I last provided but may also request a printed copy by calling the office of the [facility].  I understand that my insurance will be billed for this visit.   I have read or had this consent read to me.  I understand the contents of this consent, which adequately explains the benefits and risks of the Services being provided via telemedicine.  I have been provided ample opportunity to ask questions regarding this consent and the Services and have had my questions answered to my satisfaction.  I give my  informed consent for the services to be provided through the use of telemedicine in my medical care  By participating in this telemedicine visit I agree to the above.

## 2022-02-15 ENCOUNTER — Other Ambulatory Visit (HOSPITAL_COMMUNITY): Payer: Self-pay | Admitting: *Deleted

## 2022-02-15 ENCOUNTER — Encounter (HOSPITAL_COMMUNITY): Payer: Self-pay

## 2022-02-15 ENCOUNTER — Other Ambulatory Visit (HOSPITAL_COMMUNITY): Payer: Medicare Other | Admitting: Physician Assistant

## 2022-02-15 DIAGNOSIS — I4819 Other persistent atrial fibrillation: Secondary | ICD-10-CM

## 2022-02-16 ENCOUNTER — Encounter (HOSPITAL_COMMUNITY): Admission: RE | Payer: Self-pay | Source: Home / Self Care

## 2022-02-16 ENCOUNTER — Ambulatory Visit (HOSPITAL_COMMUNITY): Admission: RE | Admit: 2022-02-16 | Payer: Medicare Other | Source: Home / Self Care | Admitting: Cardiology

## 2022-02-16 SURGERY — CARDIOVERSION
Anesthesia: General

## 2022-02-20 ENCOUNTER — Other Ambulatory Visit (HOSPITAL_COMMUNITY): Payer: Self-pay

## 2022-02-20 MED ORDER — NITROGLYCERIN 0.4 MG SL SUBL
SUBLINGUAL_TABLET | SUBLINGUAL | 0 refills | Status: AC
  Filled 2022-02-20: qty 25, 8d supply, fill #0

## 2022-02-20 MED ORDER — TESTOSTERONE CYPIONATE 100 MG/ML IM SOLN
INTRAMUSCULAR | 0 refills | Status: DC
  Filled 2022-02-20: qty 10, 28d supply, fill #0

## 2022-02-21 ENCOUNTER — Telehealth (HOSPITAL_COMMUNITY): Payer: Self-pay | Admitting: *Deleted

## 2022-02-21 NOTE — Telephone Encounter (Signed)
Labs collected on 10/4 for upcoming cardioversion under care everywhere for review.

## 2022-02-22 ENCOUNTER — Other Ambulatory Visit (HOSPITAL_COMMUNITY): Payer: Self-pay

## 2022-02-23 ENCOUNTER — Ambulatory Visit: Payer: Medicare Other | Admitting: Cardiology

## 2022-02-23 ENCOUNTER — Other Ambulatory Visit: Payer: Self-pay

## 2022-02-23 ENCOUNTER — Other Ambulatory Visit (HOSPITAL_COMMUNITY): Payer: Self-pay

## 2022-02-28 ENCOUNTER — Other Ambulatory Visit (HOSPITAL_COMMUNITY): Payer: Self-pay

## 2022-02-28 ENCOUNTER — Ambulatory Visit: Payer: Medicare Other | Attending: Cardiology | Admitting: Cardiology

## 2022-02-28 VITALS — BP 129/78 | HR 81 | Temp 97.0°F | Wt 184.0 lb

## 2022-02-28 DIAGNOSIS — I4819 Other persistent atrial fibrillation: Secondary | ICD-10-CM | POA: Diagnosis not present

## 2022-02-28 MED ORDER — DILTIAZEM HCL ER COATED BEADS 180 MG PO CP24: 180.0000 mg | ORAL_CAPSULE | Freq: Every day | ORAL | 1 refills | Status: DC

## 2022-02-28 MED ORDER — DILTIAZEM HCL ER COATED BEADS 180 MG PO CP24
180.0000 mg | ORAL_CAPSULE | Freq: Every day | ORAL | 2 refills | Status: DC
  Filled 2022-02-28: qty 90, 90d supply, fill #0

## 2022-02-28 MED ORDER — AMIODARONE HCL 200 MG PO TABS
200.0000 mg | ORAL_TABLET | Freq: Every day | ORAL | 2 refills | Status: DC
  Filled 2022-02-28: qty 90, 90d supply, fill #0

## 2022-02-28 MED ORDER — AMIODARONE HCL 200 MG PO TABS: ORAL_TABLET | ORAL | 0 refills | Status: DC

## 2022-02-28 NOTE — Patient Instructions (Addendum)
Medication Instructions:  Your physician has recommended you make the following change in your medication:  STOP Diltiazem 60 mg three times daily START Diltiazem 180 mg once daily 3.  START Amiodarone  - take 2 tablets (400 mg total) TWICE a day for 2 weeks, then  - take 1 tablet (200 mg total) TWICE a day for 2 weeks, then  - take 1 tablet (200 mg total) ONCE a day   *If you need a refill on your cardiac medications before your next appointment, please call your pharmacy*   Lab Work: None ordered  If you have labs (blood work) drawn today and your tests are completely normal, you will receive your results only by: MyChart Message (if you have MyChart) OR A paper copy in the mail If you have any lab test that is abnormal or we need to change your treatment, we will call you to review the results.   Testing/Procedures: Your physician has requested that you have cardiac CT within 7 days PRIOR to your ablation. Cardiac computed tomography (CT) is a painless test that uses an x-ray machine to take clear, detailed pictures of your heart.  Please follow instruction below located under "other instructions". You will get a call from our office to schedule the date for this test.  Your physician has recommended that you have an ablation. Catheter ablation is a medical procedure used to treat some cardiac arrhythmias (irregular heartbeats). During catheter ablation, a long, thin, flexible tube is put into a blood vessel in your groin (upper thigh), or neck. This tube is called an ablation catheter. It is then guided to your heart through the blood vessel. Radio frequency waves destroy small areas of heart tissue where abnormal heartbeats may cause an arrhythmia to start.   06/27/2022 is held for this procedure.  The nurse will be in contact at a later date to go over this procedure/information.   Follow-Up: At [facility], you and your health needs are our priority.  As part of our continuing  mission to provide you with exceptional heart care, we have created designated Provider Care Teams.  These Care Teams include your primary Cardiologist (physician) and Advanced Practice Providers (APPs -  Physician Assistants and Nurse Practitioners) who all work together to provide you with the care you need, when you need it.  Your next appointment:   1 month(s) after your ablation  The format for your next appointment:   In Person  Provider:   [facility] clinic   Thank you for choosing [facility]!!   Trinidad Curet, RN [phone]    Other Instructions   Cardiac Ablation Cardiac ablation is a procedure to destroy (ablate) some heart tissue that is sending bad signals. These bad signals cause problems in heart rhythm. The heart has many areas that make these signals. If there are problems in these areas, they can make the heart beat in a way that is not normal. Destroying some tissues can help make the heart rhythm normal. Tell your doctor about: Any allergies you have. All medicines you are taking. These include vitamins, herbs, eye drops, creams, and over-the-counter medicines. Any problems you or family members have had with medicines that make you fall asleep (anesthetics). Any blood disorders you have. Any surgeries you have had. Any medical conditions you have, such as kidney failure. Whether you are pregnant or may be pregnant. What are the risks? This is a safe procedure. But problems may occur, including: Infection. Bruising and bleeding. Bleeding  into the chest. Stroke or blood clots. Damage to nearby areas of your body. Allergies to medicines or dyes. The need for a pacemaker if the normal system is damaged. Failure of the procedure to treat the problem. What happens before the procedure? Medicines Ask your doctor about: Changing or stopping your normal medicines. This is important. Taking aspirin and ibuprofen. Do not take these medicines unless your  doctor tells you to take them. Taking other medicines, vitamins, herbs, and supplements. General instructions Follow instructions from your doctor about what you cannot eat or drink. Plan to have someone take you home from the hospital or clinic. If you will be going home right after the procedure, plan to have someone with you for 24 hours. Ask your doctor what steps will be taken to prevent infection. What happens during the procedure?  An IV tube will be put into one of your veins. You will be given a medicine to help you relax. The skin on your neck or groin will be numbed. A cut (incision) will be made in your neck or groin. A needle will be put through your cut and into a large vein. A tube (catheter) will be put into the needle. The tube will be moved to your heart. Dye may be put through the tube. This helps your doctor see your heart. Small devices (electrodes) on the tube will send out signals. A type of energy will be used to destroy some heart tissue. The tube will be taken out. Pressure will be held on your cut. This helps stop bleeding. A bandage will be put over your cut. The exact procedure may vary among doctors and hospitals. What happens after the procedure? You will be watched until you leave the hospital or clinic. This includes checking your heart rate, breathing rate, oxygen, and blood pressure. Your cut will be watched for bleeding. You will need to lie still for a few hours. Do not drive for 24 hours or as long as your doctor tells you. Summary Cardiac ablation is a procedure to destroy some heart tissue. This is done to treat heart rhythm problems. Tell your doctor about any medical conditions you may have. Tell him or her about all medicines you are taking to treat them. This is a safe procedure. But problems may occur. These include infection, bruising, bleeding, and damage to nearby areas of your body. Follow what your doctor tells you about food and drink.  You may also be told to change or stop some of your medicines. After the procedure, do not drive for 24 hours or as long as your doctor tells you. This information is not intended to replace advice given to you by your health care provider. Make sure you discuss any questions you have with your health care provider. Document Revised: 07/28/2021 Document Reviewed: 04/09/2019 Elsevier Patient Education  [employer].

## 2022-02-28 NOTE — Progress Notes (Addendum)
Virtual Visit via Video Note   Because of Evan Russell's co-morbid illnesses, he is at least at moderate risk for complications without adequate follow up.  This format is felt to be most appropriate for this patient at this time.  All issues noted in this document were discussed and addressed.  A limited physical exam was performed with this format.  Please refer to the patient's chart for his consent to telehealth for [name].       Date:  03/01/2022   ID:  Evan Russell, DOB 24-Oct-1955, MRN [id number] The patient was identified using 2 identifiers.  Patient Location: Home Provider Location: Home Office   PCP:  Briant Sites   [place] Providers Cardiologist:  None Electrophysiologist:  Nathen Balaban Meredith Leeds, MD     Evaluation Performed:  Follow-Up Visit  Chief Complaint:  AF  History of Present Illness:    Evan Russell is a 66 y.o. male with with a history significant for apical variant hypertrophic cardiomyopathy and atrial flutter.  He is post atrial flutter ablation 05/08/2021.  He continued to have episodes of atrial fibrillation.  He is minimally symptomatic in atrial fibrillation, there would prefer maintenance of sinus rhythm.  Today, denies symptoms of palpitations, chest pain, shortness of breath, orthopnea, PND, lower extremity edema, claudication, dizziness, presyncope, syncope, bleeding, or neurologic sequela. The patient is tolerating medications without difficulties.     Past Medical History:  Diagnosis Date   Abnormal EKG 01/14/2018   Androgen deficiency    Chronic anticoagulation 10/24/2018   Dyslipidemia 01/05/2020   Elevated cholesterol    Hx of hypertrophic cardiomyopathy 09/15/2018   Minimal thickening of the apex with abnormal EKG based on the MRI   Paroxysmal atrial flutter ([place]) 01/05/2020   Tinnitus    Past Surgical History:  Procedure Laterality Date   A-FLUTTER ABLATION N/A 05/08/2021   Procedure: A-FLUTTER ABLATION;   Surgeon: Constance Haw, MD;  Location: [facility];  Service: Cardiovascular;  Laterality: N/A;   CARDIOVERSION     CARDIOVERSION N/A 03/24/2021   Procedure: CARDIOVERSION;  Surgeon: Skeet Latch, MD;  Location: [facility] ENDOSCOPY;  Service: Cardiovascular;  Laterality: N/A;     Current Meds  Medication Sig   amiodarone (PACERONE) 200 MG tablet Take 2 tablets TWICE daily for two weeks, then reduce and take 1 tablet TWICE daily for two weeks, then reduce and take 1 tablet ONCE daily.   [START ON 03/30/2022] amiodarone (PACERONE) 200 MG tablet Take 1 tablet (200 mg total) by mouth daily.   diltiazem (CARDIZEM CD) 180 MG 24 hr capsule Take 1 capsule (180 mg total) by mouth daily.   [DISCONTINUED] diltiazem (CARDIZEM CD) 180 MG 24 hr capsule Take 1 capsule (180 mg total) by mouth daily.     Allergies:   Patient has no known allergies.   Social History   Tobacco Use   Smoking status: Never   Smokeless tobacco: Never   Tobacco comments:    Never smoke 01/30/22  Substance Use Topics   Alcohol use: Yes    Alcohol/week: 13.0 - 14.0 standard drinks of alcohol    Types: 13 - 14 Standard drinks or equivalent per week    Comment: 2 drinks nightly 01/30/22   Drug use: Never     Family Hx: The patient's family history includes Atrial fibrillation in his mother; CAD in his father; Heart failure in his mother; Hyperlipidemia in his father; Idiopathic pulmonary fibrosis in his sister;  Multiple myeloma in his father; Narcolepsy in his mother.  ROS:   Please see the history of present illness.     All other systems reviewed and are negative.   Prior CV studies:   The following studies were reviewed today:  CMRI 04/07/2021 Apical hypertrophic cardiomyopathy with minimal LGE with no LGE at the area of maximal hypertrophy. LGE is < 15% of myocardium. There is no apical aneurysm.   Mildly reduced LV and RV function- imaged in atrial flutter.   LV Dysfunction and minimal LGE is  new from prior. Labs/Other Tests and Data Reviewed:    EKG:  The patient's [employer] cardiac telemetry strip(s) personally reviewed today demonstrate:  Atrial fibrillation  Recent Labs: 04/12/2021: BUN 20; Creatinine, Ser 1.05; Hemoglobin 16.7; Platelets 191; Potassium 4.1; Sodium 139   Recent Lipid Panel No results found for: "CHOL", "TRIG", "HDL", "CHOLHDL", "LDLCALC", "LDLDIRECT"  Wt Readings from Last 3 Encounters:  03/01/22 184 lb (83.5 kg)  01/30/22 183 lb 4.8 oz (83.1 kg)  08/22/21 192 lb 1.6 oz (87.1 kg)     Risk Assessment/Calculations:    CHA2DS2-VASc Score = 1   This indicates a 0.6% annual risk of stroke. The patient's score is based upon: CHF History: 0 HTN History: 0 Diabetes History: 0 Stroke History: 0 Vascular Disease History: 0 Age Score: 1 Gender Score: 0          Objective:    Vital Signs:  BP 129/78   Pulse 81   Temp (!) 97 F (36.1 C)   Wt 184 lb (83.5 kg)   SpO2 97%   BMI 27.98 kg/m    VITAL SIGNS:  reviewed GEN:  no acute distress EYES:  sclerae anicteric, EOMI - Extraocular Movements Intact RESPIRATORY:  normal respiratory effort, symmetric expansion SKIN:  no rash, lesions or ulcers. NEURO:  alert and oriented x 3, no obvious focal deficit PSYCH:  normal affect  ASSESSMENT & PLAN:    Typical atrial flutter: Status post ablation 05/08/2021.  No further episodes of atrial flutter. Persistent atrial fibrillation: Currently on diltiazem as needed.  CHA2DS2-VASc of 1.  On Xarelto 20 mg daily.  He has plans for cardioversion.  To keep him in sinus rhythm after cardioversion, we Cadynce Garrette plan to load on amiodarone.  We Emmerich Cryer continue diltiazem but switch to daily dosing.  He would prefer ablation.  We Jru Pense schedule ablation once he has been in sinus rhythm. Apical hypertrophic cardiomyopathy: No obvious volume overload.  No indication for ICD therapy at this time.       Shared Decision Making/Informed Consent The risks (stroke, cardiac  arrhythmias rarely resulting in the need for a temporary or permanent pacemaker, skin irritation or burns and complications associated with conscious sedation including aspiration, arrhythmia, respiratory failure and death), benefits (restoration of normal sinus rhythm) and alternatives of a direct current cardioversion were explained in detail to Mr. Crapps and he agrees to proceed.       Time:   Today, I have spent 25 minutes with the patient with telehealth technology discussing the above problems.     Medication Adjustments/Labs and Tests Ordered: Current medicines are reviewed at length with the patient today.  Concerns regarding medicines are outlined above.   Tests Ordered: No orders of the defined types were placed in this encounter.   Medication Changes: Meds ordered this encounter  Medications   DISCONTD: diltiazem (CARDIZEM CD) 180 MG 24 hr capsule    Sig: Take 1 capsule (180 mg total) by  mouth daily.    Dispense:  30 capsule    Refill:  1   amiodarone (PACERONE) 200 MG tablet    Sig: Take 2 tablets TWICE daily for two weeks, then reduce and take 1 tablet TWICE daily for two weeks, then reduce and take 1 tablet ONCE daily.    Dispense:  84 tablet    Refill:  0   amiodarone (PACERONE) 200 MG tablet    Sig: Take 1 tablet (200 mg total) by mouth daily.    Dispense:  90 tablet    Refill:  2   diltiazem (CARDIZEM CD) 180 MG 24 hr capsule    Sig: Take 1 capsule (180 mg total) by mouth daily.    Dispense:  90 capsule    Refill:  2    Follow Up:  In Person in 3 month(s)  Signed, Burlie Cajamarca Meredith Leeds, MD  03/01/2022 7:29 AM    [place]

## 2022-03-01 ENCOUNTER — Encounter: Payer: Self-pay | Admitting: Cardiology

## 2022-03-02 ENCOUNTER — Ambulatory Visit (HOSPITAL_COMMUNITY)
Admission: RE | Admit: 2022-03-02 | Discharge: 2022-03-02 | Disposition: A | Discharge status: Home / Self Care | Payer: Medicare Other | Attending: Cardiology | Admitting: Cardiology

## 2022-03-02 ENCOUNTER — Encounter (HOSPITAL_COMMUNITY)
Admission: RE | Disposition: A | Discharge status: Home / Self Care | Payer: Self-pay | Source: Home / Self Care | Attending: Cardiology

## 2022-03-02 ENCOUNTER — Encounter (HOSPITAL_COMMUNITY): Payer: Self-pay | Admitting: Anesthesiology

## 2022-03-02 ENCOUNTER — Encounter (HOSPITAL_COMMUNITY): Payer: Self-pay | Admitting: Cardiology

## 2022-03-02 DIAGNOSIS — I4819 Other persistent atrial fibrillation: Secondary | ICD-10-CM

## 2022-03-02 DIAGNOSIS — I4891 Unspecified atrial fibrillation: Secondary | ICD-10-CM | POA: Diagnosis present

## 2022-03-02 DIAGNOSIS — Z538 Procedure and treatment not carried out for other reasons: Secondary | ICD-10-CM | POA: Diagnosis not present

## 2022-03-02 SURGERY — CANCELLED PROCEDURE

## 2022-03-02 NOTE — Anesthesia Preprocedure Evaluation (Signed)
Anesthesia Evaluation    Reviewed: Allergy & Precautions, Patient's Chart, lab work & pertinent test results  Airway        Dental   Pulmonary neg pulmonary ROS,           Cardiovascular +CHF (grade 2 diastolic dysfunction)  + dysrhythmias (xarelto) Atrial Fibrillation   Echo 2021 1. Left ventricular ejection fraction, by estimation, is 60 to 65%. The  left ventricle has normal function. The left ventricle has no regional  wall motion abnormalities. There is mild asymmetric left ventricular  hypertrophy of the posterior segment.  Left ventricular diastolic parameters are consistent with Grade II  diastolic dysfunction (pseudonormalization).  2. Right ventricular systolic function is normal. The right ventricular  size is normal.  3. The mitral valve is normal in structure. No evidence of mitral valve  regurgitation. No evidence of mitral stenosis.  4. The aortic valve is normal in structure. Aortic valve regurgitation is  not visualized. No aortic stenosis is present.  5. The inferior vena cava is normal in size with greater than 50%  respiratory variability, suggesting right atrial pressure of 3 mmHg.    Neuro/Psych negative neurological ROS  negative psych ROS   GI/Hepatic negative GI ROS, (+)       alcohol use,   Endo/Other  negative endocrine ROS  Renal/GU negative Renal ROS  negative genitourinary   Musculoskeletal negative musculoskeletal ROS (+)   Abdominal   Peds  Hematology negative hematology ROS (+)   Anesthesia Other Findings   Reproductive/Obstetrics negative OB ROS                             Anesthesia Physical Anesthesia Plan  ASA: 3  Anesthesia Plan: General   Post-op Pain Management:    Induction: Intravenous  PONV Risk Score and Plan: TIVA and Treatment may vary due to age or medical condition  Airway Management Planned: Natural Airway and  Mask  Additional Equipment: None  Intra-op Plan:   Post-operative Plan:   Informed Consent:   Plan Discussed with:   Anesthesia Plan Comments:         Anesthesia Quick Evaluation

## 2022-03-02 NOTE — H&P (Signed)
Patient presented for cardioversion for Afib.  Found to be in sinus rhythm on presentation, confirmed with 12 lead EKG.  Cardioversion cancelled.  Donato Heinz, MD

## 2022-03-02 NOTE — Progress Notes (Signed)
Pt presented to endoscopy today for direct current cardioversion with MD Gardiner Rhyme. Upon initial pre-operative assessment, pt noted to be in normal sinus rhythm. 12 lead EKG obtained and confirmed NSR. MD Gardiner Rhyme notified and assessed at bedside. Procedure cancelled.  Debarah Crape, RN 03/02/22 9:43 AM

## 2022-03-05 ENCOUNTER — Encounter: Payer: Self-pay | Admitting: Cardiology

## 2022-03-05 ENCOUNTER — Ambulatory Visit: Payer: Medicare Other | Admitting: Cardiology

## 2022-03-05 ENCOUNTER — Ambulatory Visit: Payer: Medicare Other | Attending: Cardiology | Admitting: Cardiology

## 2022-03-05 VITALS — BP 138/79 | HR 60 | Ht 68.0 in | Wt 184.0 lb

## 2022-03-05 DIAGNOSIS — E785 Hyperlipidemia, unspecified: Secondary | ICD-10-CM

## 2022-03-05 DIAGNOSIS — I4819 Other persistent atrial fibrillation: Secondary | ICD-10-CM

## 2022-03-05 DIAGNOSIS — Z8679 Personal history of other diseases of the circulatory system: Secondary | ICD-10-CM | POA: Diagnosis not present

## 2022-03-05 DIAGNOSIS — I4892 Unspecified atrial flutter: Secondary | ICD-10-CM

## 2022-03-05 NOTE — Patient Instructions (Signed)

## 2022-03-05 NOTE — Progress Notes (Unsigned)
Virtual Visit via Video Note   Because of Evan Russell's co-morbid illnesses, he is at least at moderate risk for complications without adequate follow up.  This format is felt to be most appropriate for this patient at this time.  All issues noted in this document were discussed and addressed.  A limited physical exam was performed with this format.  Please refer to the patient's chart for his consent to telehealth for [facility].     Evaluation Performed:  Follow-up visit  This visit type was conducted due to national recommendations for restrictions regarding the COVID-19 Pandemic (e.g. social distancing).  This format is felt to be most appropriate for this patient at this time.  All issues noted in this document were discussed and addressed.  No physical exam was performed (except for noted visual exam findings with Video Visits).  Please refer to the patient's chart (MyChart message for video visits and phone note for telephone visits) for the patient's consent to telehealth for [facility].  Date:  03/05/2022  ID: Evan Russell, DOB 1955/12/24, MRN [id number]   Patient Location: [place] [facility] [address]   Provider location:   [place] Office  PCP:  [name], PA-C  Cardiologist:  Jenne Campus, MD     Chief Complaint: I am doing well   History of Present Illness:    Evan Russell is a 66 y.o. male  who presents via audio/video conferencing for a telehealth visit today.  Past medical history significant for apical variant of hypertrophic cardiomyopathy, paroxysmal atrial flutter, status post atrial fibrillation ablation, paroxysmal atrial fibrillation, dyslipidemia.  He is doing very well.  He denies of any chest pain tightness squeezing pressure burning chest no palpitations dizziness.  Recently he was admitted to the hospital with intention to cardiovert him to sinus rhythm, however he converted spontaneously to normal rhythm before  cardioversion and seems to maintain sinus rhythm since then.  Goes to gym on the regular basis does not have any palpitations no passing out no dizziness.   The patient does not have symptoms concerning for COVID-19 infection (fever, chills, cough, or new SHORTNESS OF BREATH).    Prior CV studies:   The following studies were reviewed today:       Past Medical History:  Diagnosis Date   Abnormal EKG 01/14/2018   Androgen deficiency    Chronic anticoagulation 10/24/2018   Dyslipidemia 01/05/2020   Elevated cholesterol    Hx of hypertrophic cardiomyopathy 09/15/2018   Minimal thickening of the apex with abnormal EKG based on the MRI   Paroxysmal atrial flutter ([place]) 01/05/2020   Tinnitus     Past Surgical History:  Procedure Laterality Date   A-FLUTTER ABLATION N/A 05/08/2021   Procedure: A-FLUTTER ABLATION;  Surgeon: Constance Haw, MD;  Location: [place] CV LAB;  Service: Cardiovascular;  Laterality: N/A;   CARDIOVERSION     CARDIOVERSION N/A 03/24/2021   Procedure: CARDIOVERSION;  Surgeon: Skeet Latch, MD;  Location: [facility] ENDOSCOPY;  Service: Cardiovascular;  Laterality: N/A;         Family History: The patient's family history includes Atrial fibrillation in his mother; CAD in his father; Heart failure in his mother; Hyperlipidemia in his father; Idiopathic pulmonary fibrosis in his sister; Multiple myeloma in his father; Narcolepsy in his mother.   ROS:   Please see the history of present illness.     All other systems reviewed and are negative.  Labs/Other Tests and Data Reviewed:     Recent Labs: 04/12/2021: BUN 20; Creatinine, Ser 1.05; Hemoglobin 16.7; Platelets 191; Potassium 4.1; Sodium 139  Recent Lipid Panel No results found for: "CHOL", "TRIG", "HDL", "CHOLHDL", "VLDL", "LDLCALC", "LDLDIRECT"    Exam:    Vital Signs:  BP 138/79   Pulse 60   Ht '5\' 8"'  (1.727 m)   Wt 184 lb (83.5 kg)   SpO2 98%   BMI 27.98 kg/m     Wt  Readings from Last 3 Encounters:  03/05/22 184 lb (83.5 kg)  03/01/22 184 lb (83.5 kg)  01/30/22 183 lb 4.8 oz (83.1 kg)     Well nourished, well developed in no acute distress. Alert awake and x3 very cheerful we had a long conversation.  He is not in any distress  Diagnosis for this visit:   No diagnosis found.   ASSESSMENT & PLAN:    1.  Paroxysmal atrial fibrillation.  He is in sinus rhythm.  He is anticoagulated which I will continue.  His CHADS2 vascular score does not count since he does have hypertrophic cardiomyopathy.  He is waiting for his atrial fibrillation ablation. 2.  Status post atrial flutter ablation.  Stable denies having episode of atrial flutter. 3.  History of apical hypertrophic cardiomyopathy no arrhythmia that would suggest ventricle tachycardia.  No dizziness no passing 4.  Polycythemia: He is seeing hematologist, he is scheduled to have some phlebotomy done.  Thinking is that this is most likely related to his testosterone use   COVID-19 Education: The signs and symptoms of COVID-19 were discussed with the patient and how to seek care for testing (follow up with PCP or arrange E-visit).  The importance of social distancing was discussed today.  Patient Risk:   After full review of this patients clinical status, I feel that they are at least moderate risk at this time.  Time:   Today, I have spent 10 minutes with the patient with telehealth technology discussing pt health issues.  I spent 23mnutes reviewing her chart before the visit.  Visit was finished at 4:15.    Medication Adjustments/Labs and Tests Ordered: Current medicines are reviewed at length with the patient today.  Concerns regarding medicines are outlined above.  No orders of the defined types were placed in this encounter.  Medication changes: No orders of the defined types were placed in this encounter.    Disposition:  6 months  Signed, RPark Liter MD, [facility]10/16/2023  5:13 PM    [facility]

## 2022-03-19 ENCOUNTER — Encounter: Payer: Self-pay | Admitting: Cardiology

## 2022-03-19 ENCOUNTER — Other Ambulatory Visit (HOSPITAL_COMMUNITY): Payer: Self-pay

## 2022-03-19 MED ORDER — DILTIAZEM HCL ER COATED BEADS 120 MG PO CP24
120.0000 mg | ORAL_CAPSULE | Freq: Every day | ORAL | 3 refills | Status: DC
  Filled 2022-03-19: qty 90, 90d supply, fill #0
  Filled 2022-04-19 – 2022-05-26 (×2): qty 90, 90d supply, fill #1

## 2022-03-19 NOTE — Telephone Encounter (Signed)
Called patient and sent diltiazem 120 mg by mouth daily to patient's pharmacy of choice.

## 2022-03-20 ENCOUNTER — Other Ambulatory Visit (HOSPITAL_COMMUNITY): Payer: Self-pay

## 2022-03-22 ENCOUNTER — Other Ambulatory Visit: Payer: Self-pay | Admitting: Cardiology

## 2022-03-23 ENCOUNTER — Other Ambulatory Visit (HOSPITAL_COMMUNITY): Payer: Self-pay

## 2022-03-23 MED ORDER — AMIODARONE HCL 200 MG PO TABS
200.0000 mg | ORAL_TABLET | Freq: Every day | ORAL | 3 refills | Status: DC
  Filled 2022-03-23: qty 90, 90d supply, fill #0
  Filled 2022-04-19 – 2022-06-20 (×3): qty 90, 90d supply, fill #1
  Filled 2022-09-23: qty 90, 90d supply, fill #2

## 2022-03-30 ENCOUNTER — Encounter: Payer: Self-pay | Admitting: Cardiology

## 2022-04-19 ENCOUNTER — Other Ambulatory Visit (HOSPITAL_COMMUNITY): Payer: Self-pay

## 2022-04-20 ENCOUNTER — Other Ambulatory Visit (HOSPITAL_COMMUNITY): Payer: Self-pay

## 2022-05-15 ENCOUNTER — Encounter: Payer: Self-pay | Admitting: Cardiology

## 2022-05-25 ENCOUNTER — Encounter: Payer: Self-pay | Admitting: *Deleted

## 2022-05-25 ENCOUNTER — Other Ambulatory Visit: Payer: Self-pay | Admitting: *Deleted

## 2022-05-25 ENCOUNTER — Telehealth: Payer: Self-pay | Admitting: *Deleted

## 2022-05-25 DIAGNOSIS — Z01812 Encounter for preprocedural laboratory examination: Secondary | ICD-10-CM

## 2022-05-25 DIAGNOSIS — I4819 Other persistent atrial fibrillation: Secondary | ICD-10-CM

## 2022-05-25 NOTE — Telephone Encounter (Signed)
Reviewed CT & Ablation instructions w/ pt verbally.  Also sent via mychart. Pt scheduled to see Dr. Curt Bears on 2/1 for H&P. Advised to call back  w/ any questions. Patient verbalized understanding and agreeable to plan.

## 2022-05-26 ENCOUNTER — Other Ambulatory Visit (HOSPITAL_COMMUNITY): Payer: Self-pay

## 2022-05-28 ENCOUNTER — Other Ambulatory Visit: Payer: Self-pay

## 2022-05-29 ENCOUNTER — Encounter (HOSPITAL_COMMUNITY): Payer: Self-pay

## 2022-05-29 ENCOUNTER — Other Ambulatory Visit (HOSPITAL_COMMUNITY): Payer: Self-pay

## 2022-05-30 ENCOUNTER — Other Ambulatory Visit (HOSPITAL_COMMUNITY): Payer: Self-pay

## 2022-06-15 LAB — CBC
Hematocrit: 46.4 % (ref 37.5–51.0)
Hemoglobin: 15.8 g/dL (ref 13.0–17.7)
MCH: 34.6 pg — ABNORMAL HIGH (ref 26.6–33.0)
MCHC: 34.1 g/dL (ref 31.5–35.7)
MCV: 102 fL — ABNORMAL HIGH (ref 79–97)
Platelets: 151 10*3/uL (ref 150–450)
RBC: 4.57 x10E6/uL (ref 4.14–5.80)
RDW: 12.8 % (ref 11.6–15.4)
WBC: 4.3 10*3/uL (ref 3.4–10.8)

## 2022-06-15 LAB — BASIC METABOLIC PANEL
BUN/Creatinine Ratio: 23 (ref 10–24)
BUN: 25 mg/dL (ref 8–27)
CO2: 23 mmol/L (ref 20–29)
Calcium: 9.9 mg/dL (ref 8.6–10.2)
Chloride: 102 mmol/L (ref 96–106)
Creatinine, Ser: 1.08 mg/dL (ref 0.76–1.27)
Glucose: 101 mg/dL — ABNORMAL HIGH (ref 70–99)
Potassium: 4.3 mmol/L (ref 3.5–5.2)
Sodium: 139 mmol/L (ref 134–144)
eGFR: 75 mL/min/{1.73_m2} (ref >59)

## 2022-06-20 ENCOUNTER — Other Ambulatory Visit: Payer: Self-pay

## 2022-06-21 ENCOUNTER — Ambulatory Visit: Payer: Medicare Other | Admitting: Cardiology

## 2022-06-22 ENCOUNTER — Telehealth (HOSPITAL_COMMUNITY): Payer: Self-pay | Admitting: *Deleted

## 2022-06-22 NOTE — Telephone Encounter (Signed)
Reaching out to patient to offer assistance regarding upcoming cardiac imaging study; pt verbalizes understanding of appt date/time, parking situation and where to check in, pre-test NPO status and verified current allergies; name and call back number provided for further questions should they arise  Evan Clement RN Navigator Cardiac [place] [facility] [phone] office [phone] cell  Patient aware to arrive at 8:30am.

## 2022-06-23 ENCOUNTER — Other Ambulatory Visit: Payer: Self-pay | Admitting: Cardiology

## 2022-06-25 ENCOUNTER — Ambulatory Visit (HOSPITAL_COMMUNITY)
Admission: RE | Admit: 2022-06-25 | Discharge: 2022-06-25 | Disposition: A | Discharge status: Home / Self Care | Payer: Medicare Other | Source: Ambulatory Visit | Attending: Cardiology | Admitting: Cardiology

## 2022-06-25 ENCOUNTER — Other Ambulatory Visit (HOSPITAL_COMMUNITY): Payer: Self-pay

## 2022-06-25 DIAGNOSIS — I4819 Other persistent atrial fibrillation: Secondary | ICD-10-CM

## 2022-06-25 MED ORDER — RIVAROXABAN 20 MG PO TABS
20.0000 mg | ORAL_TABLET | Freq: Every day | ORAL | 3 refills | Status: DC
  Filled 2022-06-25: qty 90, 90d supply, fill #0
  Filled 2022-09-23: qty 90, 90d supply, fill #1
  Filled 2022-12-20 – 2022-12-21 (×2): qty 90, 90d supply, fill #2
  Filled 2023-03-24: qty 30, 30d supply, fill #3
  Filled 2023-04-20: qty 30, 30d supply, fill #4
  Filled 2023-05-23: qty 30, 30d supply, fill #5

## 2022-06-25 MED ORDER — IOHEXOL 350 MG/ML SOLN
100.0000 mL | Freq: Once | INTRAVENOUS | Status: AC | PRN
  Administered 2022-06-25: 100 mL via INTRAVENOUS

## 2022-06-25 NOTE — Telephone Encounter (Signed)
Prescription refill request for Xarelto received.  Indication:afib Last office visit:10/23 Weight:83.5  kg Age:67 Scr:1.0  1/24 CrCl:84.66  ml/min  Prescription refilled

## 2022-06-26 NOTE — Pre-Procedure Instructions (Signed)
Instructed patient on the following items: Arrival time 1200 Nothing to eat or drink after midnight No meds AM of procedure Responsible person to drive you home and stay with you for 24 hrs  Have you missed any doses of anti-coagulant Xarelto- hasn't missed any doses

## 2022-06-27 ENCOUNTER — Other Ambulatory Visit: Payer: Self-pay

## 2022-06-27 ENCOUNTER — Ambulatory Visit (HOSPITAL_COMMUNITY): Payer: Medicare Other | Admitting: Anesthesiology

## 2022-06-27 ENCOUNTER — Ambulatory Visit (HOSPITAL_COMMUNITY)
Admission: RE | Admit: 2022-06-27 | Discharge: 2022-06-27 | Disposition: A | Discharge status: Home / Self Care | Payer: Medicare Other | Attending: Cardiology | Admitting: Cardiology

## 2022-06-27 ENCOUNTER — Ambulatory Visit (HOSPITAL_BASED_OUTPATIENT_CLINIC_OR_DEPARTMENT_OTHER): Payer: Medicare Other | Admitting: Anesthesiology

## 2022-06-27 ENCOUNTER — Encounter (HOSPITAL_COMMUNITY)
Admission: RE | Disposition: A | Discharge status: Home / Self Care | Payer: Self-pay | Source: Home / Self Care | Attending: Cardiology

## 2022-06-27 DIAGNOSIS — Z7901 Long term (current) use of anticoagulants: Secondary | ICD-10-CM | POA: Diagnosis not present

## 2022-06-27 DIAGNOSIS — I480 Paroxysmal atrial fibrillation: Secondary | ICD-10-CM | POA: Insufficient documentation

## 2022-06-27 DIAGNOSIS — Z8249 Family history of ischemic heart disease and other diseases of the circulatory system: Secondary | ICD-10-CM | POA: Diagnosis not present

## 2022-06-27 DIAGNOSIS — I422 Other hypertrophic cardiomyopathy: Secondary | ICD-10-CM | POA: Diagnosis not present

## 2022-06-27 DIAGNOSIS — I4891 Unspecified atrial fibrillation: Secondary | ICD-10-CM

## 2022-06-27 DIAGNOSIS — I483 Typical atrial flutter: Secondary | ICD-10-CM | POA: Insufficient documentation

## 2022-06-27 HISTORY — PX: ATRIAL FIBRILLATION ABLATION: EP1191

## 2022-06-27 LAB — POCT ACTIVATED CLOTTING TIME
Activated Clotting Time: 406 seconds
Activated Clotting Time: 498 seconds

## 2022-06-27 SURGERY — ATRIAL FIBRILLATION ABLATION
Anesthesia: General

## 2022-06-27 MED ORDER — PROPOFOL 10 MG/ML IV BOLUS
INTRAVENOUS | Status: DC | PRN
  Administered 2022-06-27: 110 mg via INTRAVENOUS

## 2022-06-27 MED ORDER — EPHEDRINE SULFATE (PRESSORS) 50 MG/ML IJ SOLN
INTRAMUSCULAR | Status: DC | PRN
  Administered 2022-06-27 (×3): 5 mg via INTRAVENOUS

## 2022-06-27 MED ORDER — HEPARIN (PORCINE) IN NACL 1000-0.9 UT/500ML-% IV SOLN
INTRAVENOUS | Status: AC
  Filled 2022-06-27: qty 500

## 2022-06-27 MED ORDER — PHENYLEPHRINE HCL-NACL 20-0.9 MG/250ML-% IV SOLN
INTRAVENOUS | Status: DC | PRN
  Administered 2022-06-27: 25 ug/min via INTRAVENOUS

## 2022-06-27 MED ORDER — ROCURONIUM BROMIDE 100 MG/10ML IV SOLN
INTRAVENOUS | Status: DC | PRN
  Administered 2022-06-27: 70 mg via INTRAVENOUS

## 2022-06-27 MED ORDER — SODIUM CHLORIDE 0.9 % IV SOLN: 250.0000 mL | INTRAVENOUS | Status: DC | PRN

## 2022-06-27 MED ORDER — ACETAMINOPHEN 325 MG PO TABS: 650.0000 mg | ORAL_TABLET | ORAL | Status: DC | PRN

## 2022-06-27 MED ORDER — PROPOFOL 500 MG/50ML IV EMUL
INTRAVENOUS | Status: DC | PRN
  Administered 2022-06-27: 125 ug/kg/min via INTRAVENOUS

## 2022-06-27 MED ORDER — SODIUM CHLORIDE 0.9% FLUSH: 3.0000 mL | Freq: Two times a day (BID) | INTRAVENOUS | Status: DC

## 2022-06-27 MED ORDER — SODIUM CHLORIDE 0.9% FLUSH: 3.0000 mL | INTRAVENOUS | Status: DC | PRN

## 2022-06-27 MED ORDER — MIDAZOLAM HCL 2 MG/2ML IJ SOLN
INTRAMUSCULAR | Status: DC | PRN
  Administered 2022-06-27: 2 mg via INTRAVENOUS

## 2022-06-27 MED ORDER — SODIUM CHLORIDE 0.9 % IV SOLN: INTRAVENOUS | Status: DC | PRN

## 2022-06-27 MED ORDER — HEPARIN SODIUM (PORCINE) 1000 UNIT/ML IJ SOLN
INTRAMUSCULAR | Status: DC | PRN
  Administered 2022-06-27: 1000 [IU] via INTRAVENOUS

## 2022-06-27 MED ORDER — ONDANSETRON HCL 4 MG/2ML IJ SOLN
INTRAMUSCULAR | Status: DC | PRN
  Administered 2022-06-27: 4 mg via INTRAVENOUS

## 2022-06-27 MED ORDER — DOBUTAMINE INFUSION FOR EP/ECHO/NUC (1000 MCG/ML)
INTRAVENOUS | Status: DC | PRN
  Administered 2022-06-27: 20 ug/kg/min via INTRAVENOUS

## 2022-06-27 MED ORDER — HEPARIN (PORCINE) IN NACL 1000-0.9 UT/500ML-% IV SOLN
INTRAVENOUS | Status: DC | PRN
  Administered 2022-06-27 (×3): 500 mL

## 2022-06-27 MED ORDER — DOBUTAMINE INFUSION FOR EP/ECHO/NUC (1000 MCG/ML)
INTRAVENOUS | Status: AC
  Filled 2022-06-27: qty 250

## 2022-06-27 MED ORDER — FENTANYL CITRATE (PF) 100 MCG/2ML IJ SOLN
INTRAMUSCULAR | Status: DC | PRN
  Administered 2022-06-27: 100 ug via INTRAVENOUS

## 2022-06-27 MED ORDER — SUGAMMADEX SODIUM 200 MG/2ML IV SOLN
INTRAVENOUS | Status: DC | PRN
  Administered 2022-06-27: 25 mg via INTRAVENOUS
  Administered 2022-06-27: 175 mg via INTRAVENOUS

## 2022-06-27 MED ORDER — PROTAMINE SULFATE 10 MG/ML IV SOLN
INTRAVENOUS | Status: DC | PRN
  Administered 2022-06-27: 40 mg via INTRAVENOUS

## 2022-06-27 MED ORDER — HEPARIN SODIUM (PORCINE) 1000 UNIT/ML IJ SOLN
INTRAMUSCULAR | Status: AC
  Filled 2022-06-27: qty 10

## 2022-06-27 MED ORDER — HEPARIN SODIUM (PORCINE) 1000 UNIT/ML IJ SOLN
INTRAMUSCULAR | Status: DC | PRN
  Administered 2022-06-27: 14000 [IU] via INTRAVENOUS

## 2022-06-27 MED ORDER — ONDANSETRON HCL 4 MG/2ML IJ SOLN: 4.0000 mg | Freq: Four times a day (QID) | INTRAMUSCULAR | Status: DC | PRN

## 2022-06-27 SURGICAL SUPPLY — 20 items
BLANKET WARM UNDERBOD FULL ACC (MISCELLANEOUS) ×1 IMPLANT
CATH 8FR REPROCESSED SOUNDSTAR (CATHETERS) ×1 IMPLANT
CATH 8FR SOUNDSTAR REPROCESSED (CATHETERS) IMPLANT
CATH ABLAT QDOT MICRO BI TC DF (CATHETERS) IMPLANT
CATH OCTARAY 2.0 F 3-3-3-3-3 (CATHETERS) IMPLANT
CATH PIGTAIL STEERABLE D1 8.7 (WIRE) IMPLANT
CATH S-M CIRCA TEMP PROBE (CATHETERS) IMPLANT
CATH WEB BI DIR CSDF CRV REPRO (CATHETERS) IMPLANT
CLOSURE PERCLOSE PROSTYLE (VASCULAR PRODUCTS) IMPLANT
COVER SWIFTLINK CONNECTOR (BAG) ×1 IMPLANT
PACK EP LATEX FREE (CUSTOM PROCEDURE TRAY) ×1
PACK EP LF (CUSTOM PROCEDURE TRAY) ×1 IMPLANT
PAD DEFIB RADIO PHYSIO CONN (PAD) ×1 IMPLANT
PATCH CARTO3 (PAD) IMPLANT
SHEATH CARTO VIZIGO SM CVD (SHEATH) IMPLANT
SHEATH PINNACLE 7F 10CM (SHEATH) IMPLANT
SHEATH PINNACLE 8F 10CM (SHEATH) IMPLANT
SHEATH PINNACLE 9F 10CM (SHEATH) IMPLANT
SHEATH PROBE COVER 6X72 (BAG) IMPLANT
TUBING SMART ABLATE COOLFLOW (TUBING) IMPLANT

## 2022-06-27 NOTE — Progress Notes (Signed)
Pt ambulated to and from bathroom with no signs of oozing from bilateral groin sites 

## 2022-06-27 NOTE — Transfer of Care (Signed)
Immediate Anesthesia Transfer of Care Note  Patient: Evan Russell  Procedure(s) Performed: ATRIAL FIBRILLATION ABLATION  Patient Location: PACU  Anesthesia Type:General  Level of Consciousness: awake and patient cooperative  Airway & Oxygen Therapy: Patient Spontanous Breathing  Post-op Assessment: Report given to RN, Post -op Vital signs reviewed and stable, and Patient moving all extremities  Post vital signs: Reviewed and stable  Last Vitals:  Vitals Value Taken Time  BP 117/56 06/27/22 1558  Temp 36.7 C 06/27/22 1552  Pulse 53 06/27/22 1600  Resp 20 06/27/22 1600  SpO2 94 % 06/27/22 1600  Vitals shown include unvalidated device data.  Last Pain:  Vitals:   06/27/22 1552  TempSrc: Temporal  PainSc: 0-No pain         Complications: There were no known notable events for this encounter.

## 2022-06-27 NOTE — Anesthesia Procedure Notes (Signed)
Procedure Name: Intubation Date/Time: 06/27/2022 2:17 PM  Performed by: Elvin So, CRNAPre-anesthesia Checklist: Patient identified, Emergency Drugs available, Suction available and Patient being monitored Patient Re-evaluated:Patient Re-evaluated prior to induction Oxygen Delivery Method: Circle System Utilized Preoxygenation: Pre-oxygenation with 100% oxygen Induction Type: IV induction Ventilation: Mask ventilation without difficulty Laryngoscope Size: Mac and 4 Grade View: Grade II Tube type: Oral Tube size: 7.5 mm Number of attempts: 1 Airway Equipment and Method: Stylet and Oral airway Placement Confirmation: ETT inserted through vocal cords under direct vision, positive ETCO2 and breath sounds checked- equal and bilateral Secured at: 22 cm Tube secured with: Tape Dental Injury: Teeth and Oropharynx as per pre-operative assessment

## 2022-06-27 NOTE — Anesthesia Postprocedure Evaluation (Signed)
Anesthesia Post Note  Patient: Evan Russell  Procedure(s) Performed: ATRIAL FIBRILLATION ABLATION     Patient location during evaluation: PACU Anesthesia Type: General Level of consciousness: awake and alert Pain management: pain level controlled Vital Signs Assessment: post-procedure vital signs reviewed and stable Respiratory status: spontaneous breathing, nonlabored ventilation, respiratory function stable and patient connected to nasal cannula oxygen Cardiovascular status: blood pressure returned to baseline and stable Postop Assessment: no apparent nausea or vomiting Anesthetic complications: no  There were no known notable events for this encounter.  Last Vitals:  Vitals:   06/27/22 1625 06/27/22 1630  BP: (!) 115/52 (!) 119/59  Pulse: (!) 53 (!) 53  Resp: 13 20  Temp:    SpO2: 96% 97%    Last Pain:  Vitals:   06/27/22 1650  TempSrc:   PainSc: 0-No pain   Pain Goal:                   Effie Berkshire

## 2022-06-27 NOTE — H&P (Signed)
Date:  06/27/2022   ID:  Evan Russell, DOB 1955/09/15, MRN [id number] The patient was identified using 2 identifiers.  Patient Location: Home Provider Location: Home Office   PCP:  Briant Sites   [place] Providers Cardiologist:  None Electrophysiologist:  Korrine Sicard Meredith Leeds, MD     Evaluation Performed:  Follow-Up Visit  Chief Complaint:  AF  History of Present Illness:    Evan Russell is a 67 y.o. male with with a history significant for apical variant hypertrophic cardiomyopathy and atrial flutter.  He is post atrial flutter ablation 05/08/2021.  He continued to have episodes of atrial fibrillation.  He is minimally symptomatic in atrial fibrillation, there would prefer maintenance of sinus rhythm.  Today, denies symptoms of palpitations, chest pain, shortness of breath, orthopnea, PND, lower extremity edema, claudication, dizziness, presyncope, syncope, bleeding, or neurologic sequela. The patient is tolerating medications without difficulties. Plan ablation today.    Past Medical History:  Diagnosis Date   Abnormal EKG 01/14/2018   Androgen deficiency    Chronic anticoagulation 10/24/2018   Dyslipidemia 01/05/2020   Elevated cholesterol    Hx of hypertrophic cardiomyopathy 09/15/2018   Minimal thickening of the apex with abnormal EKG based on the MRI   Paroxysmal atrial flutter ([place]) 01/05/2020   Tinnitus    Past Surgical History:  Procedure Laterality Date   A-FLUTTER ABLATION N/A 05/08/2021   Procedure: A-FLUTTER ABLATION;  Surgeon: Constance Haw, MD;  Location: [place] [facility] LAB;  Service: Cardiovascular;  Laterality: N/A;   CARDIOVERSION     CARDIOVERSION N/A 03/24/2021   Procedure: CARDIOVERSION;  Surgeon: Skeet Latch, MD;  Location: [facility] ENDOSCOPY;  Service: Cardiovascular;  Laterality: N/A;     Current Meds  Medication Sig   amiodarone (PACERONE) 200 MG tablet Take 1 tablet (200 mg total) by mouth daily.   atorvastatin  (LIPITOR) 10 MG tablet TAKE 1 TABLET BY MOUTH ONCE DAILY (Patient taking differently: Take 10 mg by mouth at bedtime.)   B Complex-C-Folic Acid (SUPER B COMPLEX/FA/VIT C) TABS Take 1 tablet by mouth daily.   diltiazem (CARDIZEM) 60 MG tablet Take 60 mg by mouth at bedtime.   fexofenadine (ALLEGRA) 180 MG tablet Take 180 mg by mouth daily as needed for allergies or rhinitis.   Methylcobalamin (B-12) 5000 MCG TBDP Take 5,000 mcg by mouth daily.   Multiple Vitamin (MULTIVITAMIN) tablet Take 1 tablet by mouth daily.   nitroGLYCERIN (NITROSTAT) 0.4 MG SL tablet Dissolve 1 tablet (0.4 mg total) under the tongue every 5 (five) minutes as needed for chest pain   Psyllium (METAMUCIL FIBER PO) Take 6 capsules by mouth daily.   rivaroxaban (XARELTO) 20 MG TABS tablet Take 1 tablet (20 mg total) by mouth daily with supper.   triamcinolone (NASACORT) 55 MCG/ACT AERO nasal inhaler Place 1 spray into the nose daily as needed (Sinus/ allergies).   Vitamin D, Cholecalciferol, 50 MCG (2000 UT) CAPS Take 2,000 Units by mouth daily.     Allergies:   Patient has no known allergies.   Social History   Tobacco Use   Smoking status: Never   Smokeless tobacco: Never   Tobacco comments:    Never smoke 01/30/22  Substance Use Topics   Alcohol use: Yes    Alcohol/week: 13.0 - 14.0 standard drinks of alcohol    Types: 13 - 14 Standard drinks or equivalent per week    Comment: 2 drinks nightly 01/30/22   Drug use: Never  Family Hx: The patient's family history includes Atrial fibrillation in his mother; CAD in his father; Heart failure in his mother; Hyperlipidemia in his father; Idiopathic pulmonary fibrosis in his sister; Multiple myeloma in his father; Narcolepsy in his mother.  ROS:  Please see the history of present illness.   Otherwise, review of systems is positive for none.   All other systems are reviewed and negative.   PHYSICAL EXAM: VS:  BP (!) 147/73   Pulse (!) 48   Temp 97.9 F (36.6 C)  (Oral)   Resp 17   Ht '5\' 8"'$  (1.727 m)   Wt 81.6 kg   SpO2 97%   BMI 27.37 kg/m  , BMI Body mass index is 27.37 kg/m. GEN: Well nourished, well developed, in no acute distress  HEENT: normal  Neck: no JVD, carotid bruits, or masses Cardiac: RRR; no murmurs, rubs, or gallops,no edema  Respiratory:  clear to auscultation bilaterally, normal work of breathing GI: soft, nontender, nondistended, + BS MS: no deformity or atrophy  Skin: warm and dry Neuro:  Strength and sensation are intact Psych: euthymic mood, full affect  Labs/Other Tests and Data Reviewed:    EKG:  The patient's [place] cardiac telemetry strip(s) personally reviewed today demonstrate:  Atrial fibrillation  Recent Labs: 06/14/2022: BUN 25; Creatinine, Ser 1.08; Hemoglobin 15.8; Platelets 151; Potassium 4.3; Sodium 139   Recent Lipid Panel No results found for: "CHOL", "TRIG", "HDL", "CHOLHDL", "LDLCALC", "LDLDIRECT"  Wt Readings from Last 3 Encounters:  06/27/22 81.6 kg  03/05/22 83.5 kg  03/01/22 83.5 kg     Risk Assessment/Calculations:    CHA2DS2-VASc Score = 1   This indicates a 0.6% annual risk of stroke. The patient's score is based upon: CHF History: 0 HTN History: 0 Diabetes History: 0 Stroke History: 0 Vascular Disease History: 0 Age Score: 1 Gender Score: 0          Objective:    Vital Signs:  BP (!) 147/73   Pulse (!) 48   Temp 97.9 F (36.6 C) (Oral)   Resp 17   Ht '5\' 8"'$  (1.727 m)   Wt 81.6 kg   SpO2 97%   BMI 27.37 kg/m    VITAL SIGNS:  reviewed GEN:  no acute distress EYES:  sclerae anicteric, EOMI - Extraocular Movements Intact RESPIRATORY:  normal respiratory effort, symmetric expansion SKIN:  no rash, lesions or ulcers. NEURO:  alert and oriented x 3, no obvious focal deficit PSYCH:  normal affect  ASSESSMENT & PLAN:    Typical atrial flutter: Status post ablation 05/08/2021.  No further episodes of atrial flutter. Persistent atrial fibrillation: Evan Russell  has presented today for surgery, with the diagnosis of AF.  The various methods of treatment have been discussed with the patient and family. After consideration of risks, benefits and other options for treatment, the patient has consented to  Procedure(s): Catheter ablation as a surgical intervention .  Risks include but not limited to complete heart block, stroke, esophageal damage, nerve damage, bleeding, vascular damage, tamponade, perforation, MI, and death. The patient's history has been reviewed, patient examined, no change in status, stable for surgery.  I have reviewed the patient's chart and labs.  Questions were answered to the patient's satisfaction.    [place] Bellemare Curt Bears, MD 06/27/2022 1:17 PM

## 2022-06-27 NOTE — Discharge Instructions (Signed)
Post procedure care instructions No driving for 4 days. No lifting over 5 lbs for 1 week. No vigorous or sexual activity for 1 week. You may return to work/your usual activities on 07/05/22. Keep procedure site clean & dry. If you notice increased pain, swelling, bleeding or pus, call/return!  You may shower after 24 hours, but no soaking in baths/hot tubs/pools for 1 week.    You have an appointment set up with the Atrial Fibrillation Clinic.  Multiple studies have shown that being followed by a dedicated atrial fibrillation clinic in addition to the standard care you receive from your other physicians improves health. We believe that enrollment in the atrial fibrillation clinic will allow us to better care for you.   The phone number to the Atrial Fibrillation Clinic is [phone]. The clinic is staffed Monday through Friday from 8:30am to 5pm.  Directions: The clinic is located in the [place] hospital, 6TH FLOOR Enter the hospital at the MAIN ENTRANCE "A", use North Tower Elevators to the 6th floor.  Registration desk to the right of elevators on 6th floor  If you have any trouble locating the clinic, please Kelii't hesitate to call [phone].     

## 2022-06-27 NOTE — Anesthesia Preprocedure Evaluation (Addendum)
Anesthesia Evaluation  Patient identified by MRN, date of birth, ID band Patient awake    Reviewed: Allergy & Precautions, NPO status , Patient's Chart, lab work & pertinent test results  History of Anesthesia Complications Negative for: history of anesthetic complications  Airway Mallampati: I  TM Distance: >3 FB Neck ROM: Full    Dental  (+) Teeth Intact, Dental Advisory Given   Pulmonary neg pulmonary ROS   breath sounds clear to auscultation       Cardiovascular + dysrhythmias Atrial Fibrillation  Rhythm:Regular     Neuro/Psych negative neurological ROS  negative psych ROS   GI/Hepatic negative GI ROS, Neg liver ROS,,,  Endo/Other  negative endocrine ROS    Renal/GU negative Renal ROS     Musculoskeletal negative musculoskeletal ROS (+)    Abdominal   Peds  Hematology  (+) Blood dyscrasia xarelto   Anesthesia Other Findings   Reproductive/Obstetrics                             Anesthesia Physical Anesthesia Plan  ASA: 2  Anesthesia Plan: General   Post-op Pain Management: Minimal or no pain anticipated   Induction: Intravenous  PONV Risk Score and Plan: 2 and Ondansetron, Dexamethasone, Propofol infusion and TIVA  Airway Management Planned: Oral ETT  Additional Equipment: None  Intra-op Plan:   Post-operative Plan: Extubation in OR  Informed Consent: I have reviewed the patients History and Physical, chart, labs and discussed the procedure including the risks, benefits and alternatives for the proposed anesthesia with the patient or authorized representative who has indicated his/her understanding and acceptance.     Dental advisory given  Plan Discussed with: CRNA  Anesthesia Plan Comments:        Anesthesia Quick Evaluation

## 2022-06-28 ENCOUNTER — Encounter (HOSPITAL_COMMUNITY): Payer: Self-pay | Admitting: Cardiology

## 2022-07-02 NOTE — Anesthesia Postprocedure Evaluation (Signed)
Anesthesia Post Note  Patient: Evan Russell  Procedure(s) Performed: ATRIAL FIBRILLATION ABLATION     Patient location during evaluation: Cath Lab Anesthesia Type: General Level of consciousness: awake and alert Pain management: pain level controlled Vital Signs Assessment: post-procedure vital signs reviewed and stable Respiratory status: spontaneous breathing, nonlabored ventilation and respiratory function stable Cardiovascular status: blood pressure returned to baseline and stable Postop Assessment: no apparent nausea or vomiting Anesthetic complications: no   There were no known notable events for this encounter.  Last Vitals:  Vitals:   06/27/22 1802 06/27/22 1900  BP: (!) 145/74 126/68  Pulse: (!) 54 (!) 53  Resp: 18 13  Temp:    SpO2: 96% 96%    Last Pain:  Vitals:   06/27/22 1650  TempSrc:   PainSc: 0-No pain   Pain Goal:                   [name]

## 2022-07-18 ENCOUNTER — Encounter: Payer: Self-pay | Admitting: Cardiology

## 2022-07-18 NOTE — Telephone Encounter (Signed)
Spoke to patient, he was just taking Tylenol 325 mg once or twice week. Advised to try some topical rubs with Tylenol Arthritis 2 tabs twice daily. Call us back if pain does not relived with these options.

## 2022-07-24 ENCOUNTER — Other Ambulatory Visit: Payer: Self-pay

## 2022-07-27 ENCOUNTER — Ambulatory Visit (HOSPITAL_COMMUNITY)
Admission: RE | Admit: 2022-07-27 | Discharge: 2022-07-27 | Disposition: A | Discharge status: Home / Self Care | Payer: Medicare Other | Source: Ambulatory Visit | Attending: Physician Assistant | Admitting: Physician Assistant

## 2022-07-27 ENCOUNTER — Encounter (HOSPITAL_COMMUNITY): Payer: Self-pay | Admitting: Physician Assistant

## 2022-07-27 VITALS — BP 144/78 | HR 52 | Ht 68.0 in | Wt 185.2 lb

## 2022-07-27 DIAGNOSIS — I4819 Other persistent atrial fibrillation: Secondary | ICD-10-CM | POA: Insufficient documentation

## 2022-07-27 DIAGNOSIS — Z8249 Family history of ischemic heart disease and other diseases of the circulatory system: Secondary | ICD-10-CM | POA: Insufficient documentation

## 2022-07-27 DIAGNOSIS — Z7901 Long term (current) use of anticoagulants: Secondary | ICD-10-CM | POA: Diagnosis not present

## 2022-07-27 DIAGNOSIS — I4892 Unspecified atrial flutter: Secondary | ICD-10-CM | POA: Diagnosis not present

## 2022-07-27 DIAGNOSIS — R001 Bradycardia, unspecified: Secondary | ICD-10-CM | POA: Diagnosis not present

## 2022-07-27 DIAGNOSIS — I422 Other hypertrophic cardiomyopathy: Secondary | ICD-10-CM | POA: Insufficient documentation

## 2022-07-27 DIAGNOSIS — Z79899 Other long term (current) drug therapy: Secondary | ICD-10-CM | POA: Diagnosis not present

## 2022-07-27 DIAGNOSIS — Z5181 Encounter for therapeutic drug level monitoring: Secondary | ICD-10-CM | POA: Diagnosis not present

## 2022-07-27 NOTE — Progress Notes (Signed)
Primary Care Physician: [name], PA-C Primary Cardiologist: Dr Agustin Cree Primary Electrophysiologist: Dr Curt Bears Referring Physician: Dr Hermenia Fiscal is a 67 y.o. male with a history of atrial flutter, HCM, atrial fibrillation who presents to the [facility] for follow up. Patient reports that on 01/02/22 his heart went out of rhythm. He had recently started prednisone and methocarbamol for lower back pain. He has remained out of rhythm since, rate controlled by his Kardia mobile. Patient is on Xarelto for a CHADS2VASC score of 1. Seen via telehealth by Dr Curt Bears 02/28/22 and started on amiodarone as a bridge to ablation. He presented for DCCV on 03/02/22 and was in SR at that time, DCCV cancelled. He underwent afib ablation with Dr Curt Bears on 06/27/22.   On follow up today, patient reports that he has done well since his ablation. He has not had any afib episodes detected on his [facility]. He denies significant chest pain, swallowing pain, or groin issues. He has been fatigued with slow heart rates.   Today, he denies symptoms of palpitations, chest pain, shortness of breath, orthopnea, PND, lower extremity edema, dizziness, presyncope, syncope, snoring, daytime somnolence, bleeding, or neurologic sequela. The patient is tolerating medications without difficulties and is otherwise without complaint today.    Atrial Fibrillation Risk Factors:  he does have symptoms or diagnosis of sleep apnea. he does not have a history of rheumatic fever. he does have a history of alcohol use. The patient does have a history of early familial atrial fibrillation or other arrhythmias. Mother and father had afib.  he has a BMI of Body mass index is 28.16 kg/m.Marland Kitchen Filed Weights   07/27/22 1127  Weight: 84 kg    Family History  Problem Relation Age of Onset   Hyperlipidemia Father    CAD Father    Multiple myeloma Father    Atrial fibrillation Mother     Heart failure Mother    Narcolepsy Mother    Idiopathic pulmonary fibrosis Sister      Atrial Fibrillation Management history:  Previous antiarrhythmic drugs: amiodarone Previous cardioversions: 2022 Previous ablations: flutter 2022, 06/27/22 CHADS2VASC score: 1 Anticoagulation history: Xarelto    Past Medical History:  Diagnosis Date   Abnormal EKG 01/14/2018   Androgen deficiency    Chronic anticoagulation 10/24/2018   Dyslipidemia 01/05/2020   Elevated cholesterol    Hx of hypertrophic cardiomyopathy 09/15/2018   Minimal thickening of the apex with abnormal EKG based on the MRI   Paroxysmal atrial flutter ([place]) 01/05/2020   Tinnitus    Past Surgical History:  Procedure Laterality Date   A-FLUTTER ABLATION N/A 05/08/2021   Procedure: A-FLUTTER ABLATION;  Surgeon: Constance Haw, MD;  Location: [place] [facility];  Service: Cardiovascular;  Laterality: N/A;   ATRIAL FIBRILLATION ABLATION N/A 06/27/2022   Procedure: ATRIAL FIBRILLATION ABLATION;  Surgeon: Constance Haw, MD;  Location: [place] [facility];  Service: Cardiovascular;  Laterality: N/A;   CARDIOVERSION     CARDIOVERSION N/A 03/24/2021   Procedure: CARDIOVERSION;  Surgeon: Skeet Latch, MD;  Location: [facility] ENDOSCOPY;  Service: Cardiovascular;  Laterality: N/A;    Current Outpatient Medications  Medication Sig Dispense Refill   amiodarone (PACERONE) 200 MG tablet Take 1 tablet (200 mg total) by mouth daily. 90 tablet 3   atorvastatin (LIPITOR) 10 MG tablet TAKE 1 TABLET BY MOUTH ONCE DAILY 90 tablet 3   B Complex-C-Folic Acid (SUPER B COMPLEX/FA/VIT C) TABS Take  1 tablet by mouth daily.     fexofenadine (ALLEGRA) 180 MG tablet Take 180 mg by mouth daily as needed for allergies or rhinitis.     Methylcobalamin (B-12) 5000 MCG TBDP Take 5,000 mcg by mouth daily.     Multiple Vitamin (MULTIVITAMIN) tablet Take 1 tablet by mouth daily.     nitroGLYCERIN (NITROSTAT) 0.4 MG SL tablet Dissolve 1 tablet (0.4  mg total) under the tongue every 5 (five) minutes as needed for chest pain 25 tablet 0   Psyllium (METAMUCIL FIBER PO) Take 6 capsules by mouth daily.     rivaroxaban (XARELTO) 20 MG TABS tablet Take 1 tablet (20 mg total) by mouth daily with supper. 90 tablet 3   testosterone cypionate (DEPOTESTOTERONE CYPIONATE) 100 MG/ML injection Inject 1 mL (100 mg total) into the muscle every 14 (fourteen) days. DISCARD 28 DAYS AFTER FIRST USE 10 mL 0   triamcinolone (NASACORT) 55 MCG/ACT AERO nasal inhaler Place 1 spray into the nose daily as needed (Sinus/ allergies).     Vitamin D, Cholecalciferol, 50 MCG (2000 UT) CAPS Take 2,000 Units by mouth daily.     No current facility-administered medications for this encounter.    No Known Allergies  Social History   Socioeconomic History   Marital status: Married    Spouse name: Not on file   Number of children: Not on file   Years of education: Not on file   Highest education level: Not on file  Occupational History   Not on file  Tobacco Use   Smoking status: Never   Smokeless tobacco: Never   Tobacco comments:    Never smoke 01/30/22  Substance and Sexual Activity   Alcohol use: Yes    Alcohol/week: 14.0 standard drinks of alcohol    Types: 14 Glasses of wine per week    Comment: 2 drinks nightly 01/30/22   Drug use: Never   Sexual activity: Not on file  Other Topics Concern   Not on file  Social History Narrative   Not on file   Social Determinants of Health   Financial Resource Strain: Not on file  Food Insecurity: Not on file  Transportation Needs: Not on file  Physical Activity: Not on file  Stress: Not on file  Social Connections: Not on file  Intimate Partner Violence: Not on file     ROS- All systems are reviewed and negative except as per the HPI above.  Physical Exam: Vitals:   07/27/22 1127  BP: (!) 144/78  Pulse: (!) 52  Weight: 84 kg  Height: '5\' 8"'$  (1.727 m)    GEN- The patient is a well appearing male,  alert and oriented x 3 today.   Head- normocephalic, atraumatic Eyes-  Sclera clear, conjunctiva pink Ears- hearing intact Oropharynx- clear Neck- supple  Lungs- Clear to ausculation bilaterally, normal work of breathing Heart- Regular rate and rhythm, bradycardia, no murmurs, rubs or gallops  GI- soft, NT, ND, + BS Extremities- no clubbing, cyanosis, or edema MS- no significant deformity or atrophy Skin- no rash or lesion Psych- euthymic mood, full affect Neuro- strength and sensation are intact  Wt Readings from Last 3 Encounters:  07/27/22 84 kg  06/27/22 81.6 kg  03/05/22 83.5 kg    EKG today demonstrates  SB, ST-T changes baseline Vent. rate 52 BPM PR interval 188 ms QRS duration 82 ms QT/QTcB 490/455 ms   Echo 01/29/20 demonstrated   1. Left ventricular ejection fraction, by estimation, is 60 to 65%. The  left ventricle has normal function. The left ventricle has no regional  wall motion abnormalities. There is mild asymmetric left ventricular  hypertrophy of the posterior segment. Left ventricular diastolic parameters are consistent with Grade II diastolic dysfunction (pseudonormalization).   2. Right ventricular systolic function is normal. The right ventricular  size is normal.   3. The mitral valve is normal in structure. No evidence of mitral valve  regurgitation. No evidence of mitral stenosis.   4. The aortic valve is normal in structure. Aortic valve regurgitation is  not visualized. No aortic stenosis is present.   5. The inferior vena cava is normal in size with greater than 50%  respiratory variability, suggesting right atrial pressure of 3 mmHg.   Comparison(s): EF 60-65%.   Epic records are reviewed at length today  CHA2DS2-VASc Score = 1  The patient's score is based upon: CHF History: 0 HTN History: 0 Diabetes History: 0 Stroke History: 0 Vascular Disease History: 0 Age Score: 1 Gender Score: 0       ASSESSMENT AND PLAN: 1. Persistent  Atrial Fibrillation/atrial flutter The patient's CHA2DS2-VASc score is 1, indicating a 0.6% annual risk of stroke.   S/p flutter ablation 05/08/21 and afib ablation 06/27/22 Patient appears to be maintaining.  Continue amiodarone 200 mg daily for now. Anticipate this will be short term post ablation. If he remains bradycardic with fatigue can consider decreasing to 100 mg daily.  Stop diltiazem.  Continue Xarelto 20 mg daily with no missed doses for 3 months post ablation.   2. HCM Apical variant  Followed by Dr Elmon Kirschner.   Follow up with Dr Curt Bears as scheduled.    [facility] [address] [place], [place] [address] [phone] 07/27/2022 11:52 AM

## 2022-09-24 ENCOUNTER — Other Ambulatory Visit: Payer: Self-pay

## 2022-09-28 ENCOUNTER — Ambulatory Visit: Payer: Medicare Other | Attending: Cardiology | Admitting: Cardiology

## 2022-09-28 ENCOUNTER — Encounter: Payer: Self-pay | Admitting: Cardiology

## 2022-09-28 VITALS — BP 118/64 | HR 56 | Ht 68.0 in | Wt 184.0 lb

## 2022-09-28 DIAGNOSIS — I422 Other hypertrophic cardiomyopathy: Secondary | ICD-10-CM | POA: Diagnosis not present

## 2022-09-28 DIAGNOSIS — I480 Paroxysmal atrial fibrillation: Secondary | ICD-10-CM | POA: Diagnosis not present

## 2022-09-28 DIAGNOSIS — D6869 Other thrombophilia: Secondary | ICD-10-CM

## 2022-09-28 NOTE — Progress Notes (Signed)
Electrophysiology Office Note   Date:  09/28/2022   ID:  Evan Russell, DOB 1955-09-01, MRN [id number]  PCP:  Evan Corner, PA-C  Cardiologist:  Evan Russell Primary Electrophysiologist:  Evan Fiala Jorja Loa, MD    Chief Complaint: AF   History of Present Illness: Evan Russell is a 67 y.o. male who is being seen today for the evaluation of AF at the request of Karis Juba. Presenting today for electrophysiology evaluation.  He has a history significant for apical hypertrophic cardiomyopathy diagnosed in 2019.  MRI did not show late gadolinium and he does not have an apical aneurysm.  He has a history of atrial flutter and is status post atrial flutter ablation 05/08/2021.  Post ablation he had episodes of atrial fibrillation and is post ablation 06/27/2022.  Today, denies symptoms of palpitations, chest pain, shortness of breath, orthopnea, PND, lower extremity edema, claudication, dizziness, presyncope, syncope, bleeding, or neurologic sequela. The patient is tolerating medications without difficulties.  Since his ablation he has done well.  He has noted no further episodes of atrial fibrillation.  Able to do all of his daily activities.   Past Medical History:  Diagnosis Date   Abnormal EKG 01/14/2018   Androgen deficiency    Chronic anticoagulation 10/24/2018   Dyslipidemia 01/05/2020   Elevated cholesterol    Hx of hypertrophic cardiomyopathy 09/15/2018   Minimal thickening of the apex with abnormal EKG based on the MRI   Paroxysmal atrial flutter (HCC) 01/05/2020   Tinnitus    Past Surgical History:  Procedure Laterality Date   A-FLUTTER ABLATION N/A 05/08/2021   Procedure: A-FLUTTER ABLATION;  Surgeon: Regan Lemming, MD;  Location: [facility];  Service: Cardiovascular;  Laterality: N/A;   ATRIAL FIBRILLATION ABLATION N/A 06/27/2022   Procedure: ATRIAL FIBRILLATION ABLATION;  Surgeon: Regan Lemming, MD;  Location: [facility]AS[facility] LAB;  Service:  Cardiovascular;  Laterality: N/A;   CARDIOVERSION     CARDIOVERSION N/A 03/24/2021   Procedure: CARDIOVERSION;  Surgeon: Chilton Si, MD;  Location: [facility] ENDOSCOPY;  Service: Cardiovascular;  Laterality: N/A;     Current Outpatient Medications  Medication Sig Dispense Refill   atorvastatin (LIPITOR) 10 MG tablet TAKE 1 TABLET BY MOUTH ONCE DAILY 90 tablet 3   B Complex-C-Folic Acid (SUPER B COMPLEX/FA/VIT C) TABS Take 1 tablet by mouth daily.     fexofenadine (ALLEGRA) 180 MG tablet Take 180 mg by mouth daily as needed for allergies or rhinitis.     Methylcobalamin (B-12) 5000 MCG TBDP Take 5,000 mcg by mouth daily.     Multiple Vitamin (MULTIVITAMIN) tablet Take 1 tablet by mouth daily.     nitroGLYCERIN (NITROSTAT) 0.4 MG SL tablet Dissolve 1 tablet (0.4 mg total) under the tongue every 5 (five) minutes as needed for chest pain 25 tablet 0   Psyllium (METAMUCIL FIBER PO) Take 6 capsules by mouth daily.     rivaroxaban (XARELTO) 20 MG TABS tablet Take 1 tablet (20 mg total) by mouth daily with supper. 90 tablet 3   testosterone cypionate (DEPOTESTOTERONE CYPIONATE) 100 MG/ML injection Inject 100 mg into the muscle every 14 (fourteen) days.     triamcinolone (NASACORT) 55 MCG/ACT AERO nasal inhaler Place 1 spray into the nose daily as needed (Sinus/ allergies).     Vitamin D, Cholecalciferol, 50 MCG (2000 UT) CAPS Take 2,000 Units by mouth daily.     No current facility-administered medications for this visit.    Allergies:   Patient has  no known allergies.   Social History:  The patient  reports that he has never smoked. He has never used smokeless tobacco. He reports current alcohol use of about 14.0 standard drinks of alcohol per week. He reports that he does not use drugs.   Family History:  The patient's family history includes Atrial fibrillation in his mother; CAD in his father; Heart failure in his mother; Hyperlipidemia in his father; Idiopathic pulmonary fibrosis in his  sister; Multiple myeloma in his father; Narcolepsy in his mother.   ROS:  Please see the history of present illness.   Otherwise, review of systems is positive for none.   All other systems are reviewed and negative.   PHYSICAL EXAM: VS:  BP 118/64   Pulse (!) 56   Ht 5\' 8"  (1.727 m)   Wt 184 lb (83.5 kg)   SpO2 98%   BMI 27.98 kg/m  , BMI Body mass index is 27.98 kg/m. GEN: Well nourished, well developed, in no acute distress  HEENT: normal  Neck: no JVD, carotid bruits, or masses Cardiac: RRR; no murmurs, rubs, or gallops,no edema  Respiratory:  clear to auscultation bilaterally, normal work of breathing GI: soft, nontender, nondistended, + BS MS: no deformity or atrophy  Skin: warm and dry Neuro:  Strength and sensation are intact Psych: euthymic mood, full affect  EKG:  EKG is ordered today. Personal review of the ekg ordered shows sinus rhythm   Recent Labs: 06/14/2022: BUN 25; Creatinine, Ser 1.08; Hemoglobin 15.8; Platelets 151; Potassium 4.3; Sodium 139    Lipid Panel  No results found for: "CHOL", "TRIG", "HDL", "CHOLHDL", "VLDL", "LDLCALC", "LDLDIRECT"   Wt Readings from Last 3 Encounters:  09/28/22 184 lb (83.5 kg)  07/27/22 185 lb 3.2 oz (84 kg)  06/27/22 180 lb (81.6 kg)      Other studies Reviewed: Additional studies/ records that were reviewed today include: TTE 01/29/20  Review of the above records today demonstrates:   1. Left ventricular ejection fraction, by estimation, is 60 to 65%. The  left ventricle has normal function. The left ventricle has no regional  wall motion abnormalities. There is mild asymmetric left ventricular  hypertrophy of the posterior segment.  Left ventricular diastolic parameters are consistent with Grade II  diastolic dysfunction (pseudonormalization).   2. Right ventricular systolic function is normal. The right ventricular  size is normal.   3. The mitral valve is normal in structure. No evidence of mitral valve   regurgitation. No evidence of mitral stenosis.   4. The aortic valve is normal in structure. Aortic valve regurgitation is  not visualized. No aortic stenosis is present.   5. The inferior vena cava is normal in size with greater than 50%  respiratory variability, suggesting right atrial pressure of 3 mmHg.   ASSESSMENT AND PLAN:  1.  Typical atrial flutter: CHA2DS2-VASc of 2.  Status post ablation 05/08/2021.  2.  Paroxysmal atrial fibrillation: Noted post ablation for typical atrial flutter.  Status post ablation 06/27/2022.  Currently on Xarelto and amiodarone.  CHA2DS2-VASc of 2.  He has remained in sinus rhythm since his ablation.  Due to that, we Belmira Daley stop amiodarone.  3.  Hypertrophic cardiomyopathy: Apical variant.  Has minimal LGE and no apical aneurysm.  Continue plans per primary cardiology.  4.  Secondary hypercoagulable state: Currently on Xarelto for atrial fibrillation  Current medicines are reviewed at length with the patient today.   The patient does not have concerns regarding his medicines.  The  following changes were made today: none  Labs/ tests ordered today include:  Orders Placed This Encounter  Procedures   EKG 12-Lead      Disposition:   FU 6 months  Signed, Sheniah Supak Jorja Loa, MD  09/28/2022 5:13 PM     [facility] [address] [place] [address] [phone] (office) [phone] (fax)

## 2022-09-28 NOTE — Patient Instructions (Signed)
Medication Instructions:  Your physician has recommended you make the following change in your medication:  STOP Amiodarone  *If you need a refill on your cardiac medications before your next appointment, please call your pharmacy*   Lab Work: None ordered    Testing/Procedures: None ordered   Follow-Up: At [facility], you and your health needs are our priority.  As part of our continuing mission to provide you with exceptional heart care, we have created designated Provider Care Teams.  These Care Teams include your primary Cardiologist (physician) and Advanced Practice Providers (APPs -  Physician Assistants and Nurse Practitioners) who all work together to provide you with the care you need, when you need it.   Your next appointment:   6 month(s) MyChart video visit  The format for your next appointment:   In Person  Provider:   Loman Brooklyn, MD    Thank you for choosing [facility]!!   Dory Horn, RN [phone]

## 2022-10-18 ENCOUNTER — Other Ambulatory Visit: Payer: Self-pay

## 2022-12-21 ENCOUNTER — Other Ambulatory Visit: Payer: Self-pay

## 2022-12-21 ENCOUNTER — Other Ambulatory Visit (HOSPITAL_COMMUNITY): Payer: Self-pay

## 2022-12-25 ENCOUNTER — Other Ambulatory Visit: Payer: Self-pay

## 2022-12-25 ENCOUNTER — Other Ambulatory Visit (HOSPITAL_COMMUNITY): Payer: Self-pay

## 2023-01-14 ENCOUNTER — Other Ambulatory Visit: Payer: Self-pay | Admitting: Cardiology

## 2023-01-14 ENCOUNTER — Other Ambulatory Visit: Payer: Self-pay

## 2023-01-14 ENCOUNTER — Other Ambulatory Visit (HOSPITAL_COMMUNITY): Payer: Self-pay

## 2023-01-14 MED ORDER — ATORVASTATIN CALCIUM 10 MG PO TABS
10.0000 mg | ORAL_TABLET | Freq: Every day | ORAL | 1 refills | Status: DC
  Filled 2023-01-14: qty 30, 30d supply, fill #0
  Filled 2023-02-11: qty 30, 30d supply, fill #1

## 2023-01-15 ENCOUNTER — Other Ambulatory Visit (HOSPITAL_COMMUNITY): Payer: Self-pay

## 2023-01-17 ENCOUNTER — Telehealth: Payer: Self-pay | Admitting: Cardiology

## 2023-01-17 NOTE — Telephone Encounter (Signed)
Patient has reached out via MyChart scheduling requesting a video visit  per recall for f/u with Dr. Elberta Fortis. Requesting call back to schedule.

## 2023-02-02 ENCOUNTER — Encounter: Payer: Self-pay | Admitting: Cardiology

## 2023-02-05 NOTE — Telephone Encounter (Signed)
Called pt and made aware ok to hold per Dr. Elberta Fortis. Pt appreciates my call. He will hold it starting tonight.

## 2023-02-11 ENCOUNTER — Other Ambulatory Visit: Payer: Self-pay

## 2023-02-12 ENCOUNTER — Other Ambulatory Visit: Payer: Self-pay

## 2023-03-24 ENCOUNTER — Other Ambulatory Visit: Payer: Self-pay | Admitting: Cardiology

## 2023-03-25 ENCOUNTER — Other Ambulatory Visit (HOSPITAL_COMMUNITY): Payer: Self-pay

## 2023-03-25 ENCOUNTER — Other Ambulatory Visit: Payer: Self-pay

## 2023-03-25 NOTE — Progress Notes (Unsigned)
Virtual Visit via Video Note   Because of Evan Russell's co-morbid illnesses, he is at least at moderate risk for complications without adequate follow up.  This format is felt to be most appropriate for this patient at this time.  All issues noted in this document were discussed and addressed.  A limited physical exam was performed with this format.  Please refer to the patient's chart for his consent to telehealth for [facility].       Date:  03/26/2023   ID:  Evan Russell, DOB 1956-02-10, MRN [id number] The patient was identified using 2 identifiers.  Patient Location: Home Provider Location: Office/Clinic   PCP:  Jenness Corner, PA-C   [facility] Providers Cardiologist:  None Electrophysiologist:  Faye Strohman Jorja Loa, MD     Evaluation Performed:  Follow-Up Visit  Chief Complaint:  AF  History of Present Illness:    Evan Russell is a 67 y.o. male with hypertrophic cardiomyopathy, atrial flutter post ablation 05/08/2021, atrial fibrillation post ablation 06/27/2022 presents for follow-up visit.  Today, denies symptoms of palpitations, chest pain, shortness of breath, orthopnea, PND, lower extremity edema, claudication, dizziness, presyncope, syncope, bleeding, or neurologic sequela. The patient is tolerating medications without difficulties.  He has had a few short episodes of the feels like his atrial fibrillation, though these are rare and quite short-lived.  He is overall quite happy with his control.  He still does have some anxiety that he Gorman Safi go back into atrial fibrillation, but continues to do all his daily activities.  He is not having overt bleeding from his Xarelto.   Past Medical History:  Diagnosis Date   Abnormal EKG 01/14/2018   Androgen deficiency    Chronic anticoagulation 10/24/2018   Dyslipidemia 01/05/2020   Elevated cholesterol    Hx of hypertrophic cardiomyopathy 09/15/2018   Minimal thickening of the apex with abnormal EKG based on  the MRI   Paroxysmal atrial flutter (HCC) 01/05/2020   Tinnitus    Past Surgical History:  Procedure Laterality Date   A-FLUTTER ABLATION N/A 05/08/2021   Procedure: A-FLUTTER ABLATION;  Surgeon: Regan Lemming, MD;  Location: [facility];  Service: Cardiovascular;  Laterality: N/A;   ATRIAL FIBRILLATION ABLATION N/A 06/27/2022   Procedure: ATRIAL FIBRILLATION ABLATION;  Surgeon: Regan Lemming, MD;  Location: [facility];  Service: Cardiovascular;  Laterality: N/A;   CARDIOVERSION     CARDIOVERSION N/A 03/24/2021   Procedure: CARDIOVERSION;  Surgeon: Chilton Si, MD;  Location: [facility] ENDOSCOPY;  Service: Cardiovascular;  Laterality: N/A;     No outpatient medications have been marked as taking for the 03/26/23 encounter (Video Visit) with Regan Lemming, MD.     Allergies:   Patient has no known allergies.   Social History   Tobacco Use   Smoking status: Never   Smokeless tobacco: Never   Tobacco comments:    Never smoke 01/30/22  Substance Use Topics   Alcohol use: Yes    Alcohol/week: 14.0 standard drinks of alcohol    Types: 14 Glasses of wine per week    Comment: 2 drinks nightly 01/30/22   Drug use: Never     Family Hx: The patient's family history includes Atrial fibrillation in his mother; CAD in his father; Heart failure in his mother; Hyperlipidemia in his father; Idiopathic pulmonary fibrosis in his sister; Multiple myeloma in his father; Narcolepsy in his mother.  ROS:   Please see the history of present  illness.     All other systems reviewed and are negative.   Prior CV studies:   The following studies were reviewed today:    Labs/Other Tests and Data Reviewed:    EKG:  The patient's [facility] cardiac telemetry strip(s) personally reviewed today demonstrate:  sinus rhythm  Recent Labs: 06/14/2022: BUN 25; Creatinine, Ser 1.08; Hemoglobin 15.8; Platelets 151; Potassium 4.3; Sodium 139   Recent Lipid Panel No  results found for: "CHOL", "TRIG", "HDL", "CHOLHDL", "LDLCALC", "LDLDIRECT"  Wt Readings from Last 3 Encounters:  09/28/22 184 lb (83.5 kg)  07/27/22 185 lb 3.2 oz (84 kg)  06/27/22 180 lb (81.6 kg)     Risk Assessment/Calculations:    CHA2DS2-VASc Score = 1   This indicates a 0.6% annual risk of stroke. The patient's score is based upon: CHF History: 0 HTN History: 0 Diabetes History: 0 Stroke History: 0 Vascular Disease History: 0 Age Score: 1 Gender Score: 0          Objective:    Vital Signs:  BP 129/77   Pulse (!) 53   Temp (!) 96.7 F (35.9 C)   SpO2 98%    VITAL SIGNS:  reviewed GEN:  no acute distress EYES:  sclerae anicteric, EOMI - Extraocular Movements Intact RESPIRATORY:  normal respiratory effort, symmetric expansion SKIN:  no rash, lesions or ulcers. PSYCH:  normal affect  ASSESSMENT & PLAN:    Typical atrial flutter: Post ablation 05/08/2021 Paroxysmal atrial fibrillation: Noted post ablation for atrial flutter.  Post ablation 06/27/2022. 3.  Hypertrophic cardiomyopathy: Apical variant.  Minimal LGE and no aneurysm.  Plan per primary cardiology. 4.  Secondary to coagula state: Currently on Xarelto for atrial fibrillation            Time:   Today, I have spent 30 minutes with the patient with telehealth technology discussing the above problems.     Medication Adjustments/Labs and Tests Ordered: Current medicines are reviewed at length with the patient today.  Concerns regarding medicines are outlined above.   Tests Ordered: No orders of the defined types were placed in this encounter.   Medication Changes: No orders of the defined types were placed in this encounter.   Follow Up:  Virtual Visit  in 6 month(s)  Signed, Kailo Kosik Jorja Loa, MD  03/26/2023 9:01 AM    [facility]

## 2023-03-26 ENCOUNTER — Ambulatory Visit: Payer: Medicare Other | Attending: Cardiology | Admitting: Cardiology

## 2023-03-26 ENCOUNTER — Encounter: Payer: Self-pay | Admitting: Cardiology

## 2023-03-26 VITALS — BP 129/77 | HR 53 | Temp 96.7°F

## 2023-03-26 DIAGNOSIS — I483 Typical atrial flutter: Secondary | ICD-10-CM | POA: Diagnosis not present

## 2023-03-26 DIAGNOSIS — I480 Paroxysmal atrial fibrillation: Secondary | ICD-10-CM

## 2023-03-26 DIAGNOSIS — D6869 Other thrombophilia: Secondary | ICD-10-CM

## 2023-03-26 NOTE — Patient Instructions (Signed)
Medication Instructions:  Your physician recommends that you continue on your current medications as directed. Please refer to the Current Medication list given to you today. *If you need a refill on your cardiac medications before your next appointment, please call your pharmacy*   Follow-Up: At [facility], you and your health needs are our priority.  As part of our continuing mission to provide you with exceptional heart care, we have created designated Provider Care Teams.  These Care Teams include your primary Cardiologist (physician) and Advanced Practice Providers (APPs -  Physician Assistants and Nurse Practitioners) who all work together to provide you with the care you need, when you need it.  We recommend signing up for the patient portal called "MyChart".  Sign up information is provided on this After Visit Summary.  MyChart is used to connect with patients for Virtual Visits (Telemedicine).  Patients are able to view lab/test results, encounter notes, upcoming appointments, etc.  Non-urgent messages can be sent to your provider as well.   To learn more about what you can do with MyChart, go to [email or web address].    Your next appointment:   6 month(s)  Provider:   Loman Brooklyn, MD

## 2023-03-28 ENCOUNTER — Encounter (HOSPITAL_COMMUNITY): Payer: Self-pay

## 2023-03-28 ENCOUNTER — Other Ambulatory Visit (HOSPITAL_COMMUNITY): Payer: Self-pay

## 2023-03-28 ENCOUNTER — Telehealth: Payer: Self-pay | Admitting: Cardiology

## 2023-03-28 ENCOUNTER — Other Ambulatory Visit: Payer: Self-pay

## 2023-03-28 MED ORDER — ATORVASTATIN CALCIUM 10 MG PO TABS
10.0000 mg | ORAL_TABLET | Freq: Every day | ORAL | 3 refills | Status: DC
  Filled 2023-03-28: qty 90, 90d supply, fill #0
  Filled 2023-04-20: qty 90, 90d supply, fill #1
  Filled 2023-06-18: qty 100, 100d supply, fill #1
  Filled 2023-09-18: qty 100, 100d supply, fill #2
  Filled 2023-12-13: qty 70, 70d supply, fill #3

## 2023-03-28 NOTE — Telephone Encounter (Signed)
*  STAT* If patient is at the pharmacy, call can be transferred to refill team.   1. Which medications need to be refilled? (please list name of each medication and dose if known)   atorvastatin (LIPITOR) 10 MG tablet     2. Would you like to learn more about the convenience, safety, & potential cost savings by using the [facility]? Yes    3. Are you open to using the [facility] Yes  4. Which pharmacy/location (including street and city if local pharmacy) is medication to be sent to? [place] [facility]     5. Do they need a 30 day or 90 day supply? 90 Day Supply    Pt is currently out of medication

## 2023-03-29 ENCOUNTER — Other Ambulatory Visit (HOSPITAL_COMMUNITY): Payer: Self-pay

## 2023-04-20 ENCOUNTER — Other Ambulatory Visit (HOSPITAL_COMMUNITY): Payer: Self-pay

## 2023-04-22 ENCOUNTER — Other Ambulatory Visit: Payer: Self-pay

## 2023-05-23 ENCOUNTER — Other Ambulatory Visit (HOSPITAL_COMMUNITY): Payer: Self-pay

## 2023-06-18 ENCOUNTER — Other Ambulatory Visit (HOSPITAL_COMMUNITY): Payer: Self-pay

## 2023-06-18 ENCOUNTER — Other Ambulatory Visit: Payer: Self-pay | Admitting: Cardiology

## 2023-06-18 ENCOUNTER — Other Ambulatory Visit: Payer: Self-pay

## 2023-06-18 MED ORDER — RIVAROXABAN 20 MG PO TABS
20.0000 mg | ORAL_TABLET | Freq: Every day | ORAL | 1 refills | Status: DC
  Filled 2023-06-18: qty 90, 90d supply, fill #0
  Filled 2023-09-18: qty 90, 90d supply, fill #1

## 2023-06-18 NOTE — Telephone Encounter (Signed)
Prescription refill request for Xarelto received.  Indication: A Flutter Last office visit: 09/28/22  Carleene Mains MD Weight: 83.5kg Age: 68 Scr: 1.1 on 02/26/23  Epic CrCl: 75.91  Based on above findings Xarelto 20mg  daily is the appropriate dose.  Refill approved.

## 2023-09-19 ENCOUNTER — Other Ambulatory Visit: Payer: Self-pay

## 2023-09-19 ENCOUNTER — Other Ambulatory Visit (HOSPITAL_COMMUNITY): Payer: Self-pay

## 2023-10-08 ENCOUNTER — Ambulatory Visit: Attending: Cardiology | Admitting: Cardiology

## 2023-10-08 DIAGNOSIS — I483 Typical atrial flutter: Secondary | ICD-10-CM

## 2023-10-08 DIAGNOSIS — I480 Paroxysmal atrial fibrillation: Secondary | ICD-10-CM

## 2023-10-08 DIAGNOSIS — I422 Other hypertrophic cardiomyopathy: Secondary | ICD-10-CM | POA: Diagnosis not present

## 2023-10-08 NOTE — Progress Notes (Signed)
 Virtual Visit via Video Note   Because of Evan Russell's co-morbid illnesses, he is at least at moderate risk for complications without adequate follow up.  This format is felt to be most appropriate for this patient at this time.  All issues noted in this document were discussed and addressed.  A limited physical exam was performed with this format.  Please refer to the patient's chart for his consent to telehealth for [facility].       Date:  10/08/2023   ID:  Evan Russell, DOB [id number], MRN [id number] The patient was identified using 2 identifiers.  Patient Location: Home Provider Location: Office/Clinic   PCP:  Corie Diamond, PA-C   [facility] Providers Cardiologist:  None Electrophysiologist:  Evan Keplinger Cortland Ding, MD     Evaluation Performed:  Follow-Up Visit  Chief Complaint:  atrial fib/flutter  History of Present Illness:    Evan Russell is a 68 y.o. male with history of hypertrophic cardiomyopathy, atrial flutter post ablation 05/08/2021 and atrial fibrillation post ablation 06/27/2022.  He has been doing well over the last few months.  He has been traveling quite a bit.  He was in [place] this past April.  He had some palpitations, but this was around the time of having a cigar.  Aside from that, he has been doing well.  He remains quite active.  He has been running.  He has been trying to lose weight.  He is almost fully retired.  Once he retires, he has plans to visit family and travel.   Past Medical History:  Diagnosis Date   Abnormal EKG 01/14/2018   Androgen deficiency    Chronic anticoagulation 10/24/2018   Dyslipidemia 01/05/2020   Elevated cholesterol    Hx of hypertrophic cardiomyopathy 09/15/2018   Minimal thickening of the apex with abnormal EKG based on the MRI   Paroxysmal atrial flutter (HCC) 01/05/2020   Tinnitus    Past Surgical History:  Procedure Laterality Date   A-FLUTTER ABLATION N/A 05/08/2021   Procedure: A-FLUTTER  ABLATION;  Surgeon: Lei Pump, MD;  Location: [facility] INVASIVE CV LAB;  Service: Cardiovascular;  Laterality: N/A;   ATRIAL FIBRILLATION ABLATION N/A 06/27/2022   Procedure: ATRIAL FIBRILLATION ABLATION;  Surgeon: Lei Pump, MD;  Location: [facility] INVASIVE CV LAB;  Service: Cardiovascular;  Laterality: N/A;   CARDIOVERSION     CARDIOVERSION N/A 03/24/2021   Procedure: CARDIOVERSION;  Surgeon: Maudine Sos, MD;  Location: [facility] ENDOSCOPY;  Service: Cardiovascular;  Laterality: N/A;     No outpatient medications have been marked as taking for the 10/08/23 encounter (Appointment) with Lei Pump, MD.     Allergies:   Patient has no known allergies.   Social History   Tobacco Use   Smoking status: Never   Smokeless tobacco: Never   Tobacco comments:    Never smoke 01/30/22  Substance Use Topics   Alcohol use: Yes    Alcohol/week: 14.0 standard drinks of alcohol    Types: 14 Glasses of wine per week    Comment: 2 drinks nightly 01/30/22   Drug use: Never     Family Hx: The patient's family history includes Atrial fibrillation in his mother; CAD in his father; Heart failure in his mother; Hyperlipidemia in his father; Idiopathic pulmonary fibrosis in his sister; Multiple myeloma in his father; Narcolepsy in his mother.  ROS:   Please see the history of present illness.     All other systems reviewed  and are negative.   Prior CV studies:   The following studies were reviewed today:  Kardia Mobil recording: sinus rhythm  Labs/Other Tests and Data Reviewed:    EKG:  An ECG dated 09/28/2022 was personally reviewed today and demonstrated:  Sinus rhythm  Recent Labs: No results found for requested labs within last 365 days.   Recent Lipid Panel No results found for: "CHOL", "TRIG", "HDL", "CHOLHDL", "LDLCALC", "LDLDIRECT"  Wt Readings from Last 3 Encounters:  09/28/22 184 lb (83.5 kg)  07/27/22 185 lb 3.2 oz (84 kg)  06/27/22 180 lb (81.6 kg)     Risk  Assessment/Calculations:    CHA2DS2-VASc Score = 1   This indicates a 0.6% annual risk of stroke. The patient's score is based upon: CHF History: 0 HTN History: 0 Diabetes History: 0 Stroke History: 0 Vascular Disease History: 0 Age Score: 1 Gender Score: 0          Objective:    Vital Signs:  There were no vitals taken for this visit.   VITAL SIGNS:  reviewed GEN:  no acute distress EYES:  sclerae anicteric, EOMI - Extraocular Movements Intact RESPIRATORY:  normal respiratory effort, symmetric expansion SKIN:  no rash, lesions or ulcers. NEURO:  alert and oriented x 3, no obvious focal deficit PSYCH:  normal affect  ASSESSMENT & PLAN:    Typical atrial flutter: Post ablation 05/08/2021.  No obvious recurrence. Paroxysmal atrial fibrillation: Noted post ablation for atrial flutter.  Post ablation 06/27/2022.  He is feeling well without further episodes. Hypertrophic cardiomyopathy: Apical variant.  Minimal LGE and no aneurysm.  Continue with plan per primary cardiology Secondary hypercoagulable state: Continue Xarelto  for atrial fibrillation in the setting of hypertrophic cardiomyopathy            Time:   Today, I have spent 35 minutes with the patient with telehealth technology discussing the above problems.     Medication Adjustments/Labs and Tests Ordered: Current medicines are reviewed at length with the patient today.  Concerns regarding medicines are outlined above.   Tests Ordered: No orders of the defined types were placed in this encounter.   Medication Changes: No orders of the defined types were placed in this encounter.   Follow Up:  Virtual Visit  in 1 year(s)  Signed, Sherina Stammer Cortland Ding, MD  10/08/2023 5:11 PM    [facility]

## 2023-10-09 ENCOUNTER — Encounter: Payer: Self-pay | Admitting: Cardiology

## 2023-11-17 ENCOUNTER — Encounter: Payer: Self-pay | Admitting: Cardiology

## 2023-11-28 ENCOUNTER — Other Ambulatory Visit (HOSPITAL_COMMUNITY): Payer: Self-pay

## 2023-11-28 MED ORDER — TESTOSTERONE CYPIONATE 100 MG/ML IM SOLN
100.0000 mg | INTRAMUSCULAR | 0 refills | Status: DC
  Filled 2023-11-28: qty 10, 28d supply, fill #0

## 2023-11-29 ENCOUNTER — Other Ambulatory Visit: Payer: Self-pay

## 2023-11-29 ENCOUNTER — Other Ambulatory Visit (HOSPITAL_COMMUNITY): Payer: Self-pay

## 2023-12-13 ENCOUNTER — Other Ambulatory Visit: Payer: Self-pay | Admitting: Cardiology

## 2023-12-13 ENCOUNTER — Other Ambulatory Visit (HOSPITAL_COMMUNITY): Payer: Self-pay

## 2023-12-13 DIAGNOSIS — I480 Paroxysmal atrial fibrillation: Secondary | ICD-10-CM

## 2023-12-14 ENCOUNTER — Other Ambulatory Visit (HOSPITAL_COMMUNITY): Payer: Self-pay

## 2023-12-16 ENCOUNTER — Other Ambulatory Visit (HOSPITAL_COMMUNITY): Payer: Self-pay

## 2023-12-16 ENCOUNTER — Other Ambulatory Visit: Payer: Self-pay

## 2023-12-16 MED ORDER — RIVAROXABAN 20 MG PO TABS
20.0000 mg | ORAL_TABLET | Freq: Every day | ORAL | 1 refills | Status: DC
  Filled 2023-12-16: qty 90, 90d supply, fill #0
  Filled 2024-03-20: qty 90, 90d supply, fill #1

## 2023-12-16 NOTE — Telephone Encounter (Signed)
 Prescription refill request for Xarelto  received.  Indication: Aflutter Last office visit: 10/08/23 Ritta)  Weight: 83.5kg Age: 68 Scr: 1.1 (02/26/23)  CrCl: 75.105ml/min  Appropriate dose. Refill sent.

## 2024-03-20 ENCOUNTER — Other Ambulatory Visit: Payer: Self-pay | Admitting: Cardiology

## 2024-03-20 ENCOUNTER — Other Ambulatory Visit (HOSPITAL_COMMUNITY): Payer: Self-pay

## 2024-03-20 DIAGNOSIS — E785 Hyperlipidemia, unspecified: Secondary | ICD-10-CM

## 2024-03-21 ENCOUNTER — Other Ambulatory Visit (HOSPITAL_COMMUNITY): Payer: Self-pay

## 2024-03-23 ENCOUNTER — Other Ambulatory Visit (HOSPITAL_COMMUNITY): Payer: Self-pay

## 2024-03-23 ENCOUNTER — Other Ambulatory Visit: Payer: Self-pay

## 2024-03-23 MED ORDER — ATORVASTATIN CALCIUM 10 MG PO TABS
10.0000 mg | ORAL_TABLET | Freq: Every day | ORAL | 3 refills | Status: AC
  Filled 2024-03-23: qty 90, 90d supply, fill #0
  Filled 2024-06-05: qty 90, 90d supply, fill #1

## 2024-03-24 ENCOUNTER — Other Ambulatory Visit: Payer: Self-pay

## 2024-03-24 ENCOUNTER — Other Ambulatory Visit (HOSPITAL_COMMUNITY): Payer: Self-pay

## 2024-03-24 MED ORDER — TESTOSTERONE CYPIONATE 100 MG/ML IM SOLN
50.0000 mg | INTRAMUSCULAR | 0 refills | Status: AC
  Filled 2024-03-24: qty 10, 28d supply, fill #0

## 2024-03-25 ENCOUNTER — Other Ambulatory Visit: Payer: Self-pay

## 2024-03-25 ENCOUNTER — Other Ambulatory Visit (HOSPITAL_COMMUNITY): Payer: Self-pay

## 2024-03-26 ENCOUNTER — Other Ambulatory Visit: Payer: Self-pay

## 2024-06-05 ENCOUNTER — Other Ambulatory Visit: Payer: Self-pay

## 2024-06-05 ENCOUNTER — Other Ambulatory Visit: Payer: Self-pay | Admitting: Cardiology

## 2024-06-05 DIAGNOSIS — I480 Paroxysmal atrial fibrillation: Secondary | ICD-10-CM

## 2024-06-08 NOTE — Telephone Encounter (Signed)
 In accordance with refill protocols, please review and address the following requirements before this medication refill can be authorized:  Vital signs

## 2024-06-09 ENCOUNTER — Other Ambulatory Visit (HOSPITAL_COMMUNITY): Payer: Self-pay

## 2024-06-09 MED ORDER — RIVAROXABAN 20 MG PO TABS
20.0000 mg | ORAL_TABLET | Freq: Every day | ORAL | 1 refills | Status: AC
  Filled 2024-06-09: qty 90, 90d supply, fill #0

## 2024-06-10 ENCOUNTER — Other Ambulatory Visit: Payer: Self-pay
